# Patient Record
Sex: Female | Born: 1990 | Race: White | Hispanic: No | Marital: Single | Smoking: Never smoker
Health system: Southern US, Community
[De-identification: ages and names within clinical notes are randomized; demographics above are authoritative.]

## PROBLEM LIST (undated history)

## (undated) DIAGNOSIS — D649 Anemia, unspecified: Secondary | ICD-10-CM

## (undated) DIAGNOSIS — F32A Depression, unspecified: Secondary | ICD-10-CM

## (undated) DIAGNOSIS — J45909 Unspecified asthma, uncomplicated: Secondary | ICD-10-CM

## (undated) DIAGNOSIS — N8500 Endometrial hyperplasia, unspecified: Secondary | ICD-10-CM

## (undated) DIAGNOSIS — Z8489 Family history of other specified conditions: Secondary | ICD-10-CM

## (undated) DIAGNOSIS — F419 Anxiety disorder, unspecified: Secondary | ICD-10-CM

## (undated) DIAGNOSIS — G47419 Narcolepsy without cataplexy: Secondary | ICD-10-CM

## (undated) DIAGNOSIS — T8859XA Other complications of anesthesia, initial encounter: Secondary | ICD-10-CM

## (undated) HISTORY — PX: OTHER SURGICAL HISTORY: SHX169

## (undated) HISTORY — PX: OVARIAN CYST REMOVAL: SHX89

---
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
## 2017-04-16 ENCOUNTER — Emergency Department (HOSPITAL_COMMUNITY)
Admission: EM | Admit: 2017-04-16 | Discharge: 2017-04-17 | Disposition: A | Discharge status: Home / Self Care | Payer: BLUE CROSS/BLUE SHIELD | Attending: Emergency Medicine | Admitting: Emergency Medicine

## 2017-04-16 ENCOUNTER — Encounter (HOSPITAL_COMMUNITY): Payer: Self-pay | Admitting: Emergency Medicine

## 2017-04-16 DIAGNOSIS — M25551 Pain in right hip: Secondary | ICD-10-CM | POA: Diagnosis not present

## 2017-04-16 DIAGNOSIS — M25552 Pain in left hip: Secondary | ICD-10-CM | POA: Diagnosis not present

## 2017-04-16 DIAGNOSIS — R1030 Lower abdominal pain, unspecified: Secondary | ICD-10-CM | POA: Insufficient documentation

## 2017-04-16 DIAGNOSIS — J019 Acute sinusitis, unspecified: Secondary | ICD-10-CM | POA: Insufficient documentation

## 2017-04-16 DIAGNOSIS — M545 Low back pain: Secondary | ICD-10-CM | POA: Diagnosis not present

## 2017-04-16 DIAGNOSIS — R509 Fever, unspecified: Secondary | ICD-10-CM | POA: Diagnosis not present

## 2017-04-16 DIAGNOSIS — J029 Acute pharyngitis, unspecified: Secondary | ICD-10-CM | POA: Diagnosis present

## 2017-04-16 LAB — RAPID STREP SCREEN (MED CTR MEBANE ONLY): STREPTOCOCCUS, GROUP A SCREEN (DIRECT): NEGATIVE

## 2017-04-16 MED ORDER — ACETAMINOPHEN 325 MG PO TABS
650.0000 mg | ORAL_TABLET | Freq: Once | ORAL | Status: AC | PRN
  Administered 2017-04-16: 650 mg via ORAL
  Filled 2017-04-16: qty 2

## 2017-04-16 NOTE — ED Triage Notes (Signed)
Pt comes in with complaints of bilateral hip/lower abdominal pain, back pain, URI symptoms, and fever that began yesterday into today.  Took tylenol and zyrtec around 1100.  Had some relief then but woke up feeling worse.  Low grade fever on arrival. Pt states she was around someone who had the stomach flu recently but is not having those symptoms.

## 2017-04-17 DIAGNOSIS — J019 Acute sinusitis, unspecified: Secondary | ICD-10-CM | POA: Diagnosis not present

## 2017-04-17 LAB — URINALYSIS, ROUTINE W REFLEX MICROSCOPIC
Bilirubin Urine: NEGATIVE
GLUCOSE, UA: NEGATIVE mg/dL
Hgb urine dipstick: NEGATIVE
Ketones, ur: 20 mg/dL — AB
LEUKOCYTES UA: NEGATIVE
Nitrite: NEGATIVE
PROTEIN: NEGATIVE mg/dL
SPECIFIC GRAVITY, URINE: 1.024 (ref 1.005–1.030)
pH: 5 (ref 5.0–8.0)

## 2017-04-17 LAB — POC URINE PREG, ED: PREG TEST UR: NEGATIVE

## 2017-04-17 MED ORDER — IBUPROFEN 200 MG PO TABS
600.0000 mg | ORAL_TABLET | Freq: Once | ORAL | Status: AC
  Administered 2017-04-17: 600 mg via ORAL
  Filled 2017-04-17: qty 3

## 2017-04-17 MED ORDER — MAGIC MOUTHWASH W/LIDOCAINE: 5.0000 mL | Freq: Four times a day (QID) | ORAL | 0 refills | Status: DC | PRN

## 2017-04-17 MED ORDER — FLUTICASONE PROPIONATE 50 MCG/ACT NA SUSP: 2.0000 | Freq: Every day | NASAL | 0 refills | Status: DC

## 2017-04-17 MED ORDER — IBUPROFEN 600 MG PO TABS: 600.0000 mg | ORAL_TABLET | Freq: Four times a day (QID) | ORAL | 0 refills | Status: DC | PRN

## 2017-04-17 MED ORDER — ACETAMINOPHEN 500 MG PO TABS: 500.0000 mg | ORAL_TABLET | Freq: Four times a day (QID) | ORAL | 0 refills | Status: DC | PRN

## 2017-04-17 NOTE — Discharge Instructions (Signed)
Take Tylenol as prescribed for fever and ibuprofen for fever or body aches. We recommend Magic mouthwash for sore throat. You may gargle and swallow this as prescribed. Use Flonase in addition to over-the-counter Claritin or Zyrtec for congestion. You may also try Mucinex, if desired. We advise follow-up with your primary care doctor to ensure resolution of symptoms. Be sure to drink plenty of clear liquids to prevent dehydration. You may return to the emergency department for new or concerning symptoms.

## 2017-04-17 NOTE — ED Provider Notes (Signed)
[facility] DEPT Provider Note   CSN: [id number] Arrival date & time: 04/16/17  1918    History   Chief Complaint Chief Complaint  Patient presents with  . Fever  . Back Pain  . Sore Throat  . Abdominal Pain    HPI Pamela Bates is a 26 y.o. female.  25 year old female presents to the emergency department for upper respiratory symptoms including nasal congestion and sore throat that began yesterday. Symptoms have been associated with a fever with maximum temperature of 101F. She has also noted associated bilateral hip and lower abdominal/back pain. She has been taking Zyrtec as well as Tylenol without relief of her symptoms. These were last taken at 11 AM today. She was around an individual ill with the stomach flu, though she has not had any diarrhea or vomiting. She denies any cough or shortness of breath. No inability to swallow or drooling.   The history is provided by the patient. No language interpreter was used.  Fever    Back Pain   Associated symptoms include a fever and abdominal pain.  Sore Throat  Associated symptoms include abdominal pain.  Abdominal Pain   Associated symptoms include fever.    History reviewed. No pertinent past medical history.  There are no active problems to display for this patient.   Past Surgical History:  Procedure Laterality Date  . OVARIAN CYST REMOVAL      OB History    No data available       Home Medications    Prior to Admission medications   Medication Sig Start Date End Date Taking? Authorizing Provider  acetaminophen (TYLENOL) 500 MG tablet Take 1 tablet (500 mg total) by mouth every 6 (six) hours as needed. 04/17/17   Antonietta Breach, PA-C  fluticasone (FLONASE) 50 MCG/ACT nasal spray Place 2 sprays into both nostrils daily. 04/17/17   Antonietta Breach, PA-C  ibuprofen (ADVIL,MOTRIN) 600 MG tablet Take 1 tablet (600 mg total) by mouth every 6 (six) hours as needed. 04/17/17   Antonietta Breach, PA-C  magic mouthwash  w/lidocaine SOLN Take 5 mLs by mouth 4 (four) times daily as needed (sore throat). 04/17/17   Antonietta Breach, PA-C    Family History No family history on file.  Social History Social History  Substance Use Topics  . Smoking status: Never Smoker  . Smokeless tobacco: Never Used  . Alcohol use Yes     Comment: 2-3 x a week     Allergies   Patient has no known allergies.   Review of Systems Review of Systems  Constitutional: Positive for fever.  Gastrointestinal: Positive for abdominal pain.  Musculoskeletal: Positive for back pain.  Ten systems reviewed and are negative for acute change, except as noted in the HPI.    Physical Exam Updated Vital Signs BP (!) 91/50 (BP Location: Right Arm)   Pulse 78   Temp 98.2 F (36.8 C) (Oral)   Resp 18   Ht 5\' 3"  (1.6 m)   Wt 58.3 kg (128 lb 9.6 oz)   LMP 04/02/2017 (Approximate)   SpO2 99%   BMI 22.78 kg/m   Physical Exam  Constitutional: She is oriented to person, place, and time. She appears well-developed and well-nourished. No distress.  Nontoxic appearing and in no acute distress  HENT:  Head: Normocephalic and atraumatic.  Mild posterior oropharyngeal erythema. Uvula midline. Patient tolerating secretions without difficulty.no voice hoarseness.   Eyes: Conjunctivae and EOM are normal. No scleral icterus.  Neck: Normal range of  motion.  No nuchal rigidity or meningismus  Cardiovascular: Normal rate, regular rhythm and intact distal pulses.   Patient not tachycardic as noted in triage  Pulmonary/Chest: Effort normal. No respiratory distress. She has no wheezes. She has no rales.  Respirations even and unlabored  Abdominal:  Minimal left lower quadrant tenderness. Abdomen soft, nondistended. No peritoneal signs.  Musculoskeletal: Normal range of motion.  Neurological: She is alert and oriented to person, place, and time. She exhibits normal muscle tone. Coordination normal.  GCS 15. Patient moving all extremities.  Skin:  Skin is warm and dry. No rash noted. She is not diaphoretic. No erythema. No pallor.  Psychiatric: She has a normal mood and affect. Her behavior is normal.  Nursing note and vitals reviewed.    ED Treatments / Results  Labs (all labs ordered are listed, but only abnormal results are displayed) Labs Reviewed  URINALYSIS, ROUTINE W REFLEX MICROSCOPIC - Abnormal; Notable for the following:       Result Value   APPearance HAZY (*)    Ketones, ur 20 (*)    All other components within normal limits  RAPID STREP SCREEN (NOT AT [facility])  CULTURE, GROUP A STREP ([place])  POC URINE PREG, ED    EKG  EKG Interpretation None       Radiology No results found.  Procedures Procedures (including critical care time)  Medications Ordered in ED Medications  acetaminophen (TYLENOL) tablet 650 mg (650 mg Oral Given 04/16/17 2023)  ibuprofen (ADVIL,MOTRIN) tablet 600 mg (600 mg Oral Given 04/17/17 0041)     Initial Impression / Assessment and Plan / ED Course  I have reviewed the triage vital signs and the nursing notes.  Pertinent labs & imaging results that were available during my care of the patient were reviewed by me and considered in my medical decision making (see chart for details).     Patient complaining of symptoms c/w sinusitis. Mild to moderate symptoms of clear/yellow nasal discharge/congestion and sore throat x 2 days. Patient noted to be febrile on arrival. This responded appropriately to antipyretics. Strep screen is negative. Patient also complaining of vague abdominal pain. No vomiting or diarrhea. She has minimal left lower abdominal tenderness. No distention, peritoneal signs. No tenderness at McBurney's point to suggest appendicitis. Urinalysis negative for UTI. Pregnancy also negative.  Suspect onset of symptoms to be due to viral etiology. Patient has had significant improvement in the emergency department following supportive care. Repeat exam stable. Patient to be  discharged with symptomatic treatment. Return precautions discussed and provided. Patient discharged in stable condition with no unaddressed concerns.   Final Clinical Impressions(s) / ED Diagnoses   Final diagnoses:  Febrile illness  Acute sinusitis, recurrence not specified, unspecified location    New Prescriptions New Prescriptions   ACETAMINOPHEN (TYLENOL) 500 MG TABLET    Take 1 tablet (500 mg total) by mouth every 6 (six) hours as needed.   FLUTICASONE (FLONASE) 50 MCG/ACT NASAL SPRAY    Place 2 sprays into both nostrils daily.   IBUPROFEN (ADVIL,MOTRIN) 600 MG TABLET    Take 1 tablet (600 mg total) by mouth every 6 (six) hours as needed.   MAGIC MOUTHWASH W/LIDOCAINE SOLN    Take 5 mLs by mouth 4 (four) times daily as needed (sore throat).     Antonietta Breach, PA-C 58/59/29 2446    Delora Fuel, MD 28/63/81 [phone]

## 2017-04-17 NOTE — ED Notes (Signed)
Patient denies pain and is resting comfortably.  

## 2017-04-19 LAB — CULTURE, GROUP A STREP (THRC)

## 2020-03-19 ENCOUNTER — Other Ambulatory Visit: Payer: Self-pay | Admitting: Internal Medicine

## 2020-03-19 DIAGNOSIS — R102 Pelvic and perineal pain: Secondary | ICD-10-CM

## 2020-04-13 ENCOUNTER — Ambulatory Visit
Admission: RE | Admit: 2020-04-13 | Discharge: 2020-04-13 | Disposition: A | Discharge status: Home / Self Care | Payer: BC Managed Care – PPO | Source: Ambulatory Visit | Attending: Internal Medicine | Admitting: Internal Medicine

## 2020-04-13 DIAGNOSIS — R102 Pelvic and perineal pain: Secondary | ICD-10-CM

## 2021-03-19 ENCOUNTER — Inpatient Hospital Stay (HOSPITAL_BASED_OUTPATIENT_CLINIC_OR_DEPARTMENT_OTHER)
Admission: EM | Admit: 2021-03-19 | Discharge: 2021-03-22 | DRG: 872 | Disposition: A | Discharge status: Home / Self Care | Payer: BC Managed Care – PPO | Attending: Internal Medicine | Admitting: Internal Medicine

## 2021-03-19 ENCOUNTER — Emergency Department (HOSPITAL_BASED_OUTPATIENT_CLINIC_OR_DEPARTMENT_OTHER): Payer: BC Managed Care – PPO

## 2021-03-19 ENCOUNTER — Encounter (HOSPITAL_BASED_OUTPATIENT_CLINIC_OR_DEPARTMENT_OTHER): Payer: Self-pay | Admitting: Emergency Medicine

## 2021-03-19 ENCOUNTER — Other Ambulatory Visit: Payer: Self-pay

## 2021-03-19 DIAGNOSIS — D509 Iron deficiency anemia, unspecified: Secondary | ICD-10-CM | POA: Diagnosis not present

## 2021-03-19 DIAGNOSIS — E876 Hypokalemia: Secondary | ICD-10-CM | POA: Diagnosis not present

## 2021-03-19 DIAGNOSIS — R1310 Dysphagia, unspecified: Secondary | ICD-10-CM | POA: Diagnosis present

## 2021-03-19 DIAGNOSIS — A400 Sepsis due to streptococcus, group A: Secondary | ICD-10-CM | POA: Diagnosis not present

## 2021-03-19 DIAGNOSIS — Z20822 Contact with and (suspected) exposure to covid-19: Secondary | ICD-10-CM | POA: Diagnosis present

## 2021-03-19 DIAGNOSIS — D271 Benign neoplasm of left ovary: Secondary | ICD-10-CM | POA: Diagnosis present

## 2021-03-19 DIAGNOSIS — E860 Dehydration: Secondary | ICD-10-CM | POA: Diagnosis present

## 2021-03-19 DIAGNOSIS — A419 Sepsis, unspecified organism: Principal | ICD-10-CM

## 2021-03-19 DIAGNOSIS — B373 Candidiasis of vulva and vagina: Secondary | ICD-10-CM | POA: Diagnosis present

## 2021-03-19 DIAGNOSIS — J020 Streptococcal pharyngitis: Secondary | ICD-10-CM

## 2021-03-19 HISTORY — DX: Endometrial hyperplasia, unspecified: N85.00

## 2021-03-19 HISTORY — DX: Anemia, unspecified: D64.9

## 2021-03-19 LAB — URINALYSIS, DIPSTICK ONLY
Bilirubin Urine: NEGATIVE
Glucose, UA: NEGATIVE mg/dL
Hgb urine dipstick: NEGATIVE
Ketones, ur: >80 mg/dL — AB
Nitrite: NEGATIVE
Protein, ur: 30 mg/dL — AB
Specific Gravity, Urine: 1.032 — ABNORMAL HIGH (ref 1.005–1.030)
pH: 6 (ref 5.0–8.0)

## 2021-03-19 LAB — CBC WITH DIFFERENTIAL/PLATELET
Abs Immature Granulocytes: 0.35 10*3/uL — ABNORMAL HIGH (ref 0.00–0.07)
Basophils Absolute: 0.1 10*3/uL (ref 0.0–0.1)
Basophils Relative: 0 %
Eosinophils Absolute: 0 10*3/uL (ref 0.0–0.5)
Eosinophils Relative: 0 %
HCT: 29.4 % — ABNORMAL LOW (ref 36.0–46.0)
Hemoglobin: 9.3 g/dL — ABNORMAL LOW (ref 12.0–15.0)
Immature Granulocytes: 1 %
Lymphocytes Relative: 2 %
Lymphs Abs: 0.4 10*3/uL — ABNORMAL LOW (ref 0.7–4.0)
MCH: 24.6 pg — ABNORMAL LOW (ref 26.0–34.0)
MCHC: 31.6 g/dL (ref 30.0–36.0)
MCV: 77.8 fL — ABNORMAL LOW (ref 80.0–100.0)
Monocytes Absolute: 0.8 10*3/uL (ref 0.1–1.0)
Monocytes Relative: 3 %
Neutro Abs: 25.1 10*3/uL — ABNORMAL HIGH (ref 1.7–7.7)
Neutrophils Relative %: 94 %
Platelets: 352 10*3/uL (ref 150–400)
RBC: 3.78 MIL/uL — ABNORMAL LOW (ref 3.87–5.11)
RDW: 16.3 % — ABNORMAL HIGH (ref 11.5–15.5)
WBC: 26.8 10*3/uL — ABNORMAL HIGH (ref 4.0–10.5)
nRBC: 0 % (ref 0.0–0.2)

## 2021-03-19 LAB — LACTIC ACID, PLASMA: Lactic Acid, Venous: 0.8 mmol/L (ref 0.5–1.9)

## 2021-03-19 LAB — COMPREHENSIVE METABOLIC PANEL
ALT: 10 U/L (ref 0–44)
AST: 18 U/L (ref 15–41)
Albumin: 4.2 g/dL (ref 3.5–5.0)
Alkaline Phosphatase: 28 U/L — ABNORMAL LOW (ref 38–126)
Anion gap: 11 (ref 5–15)
BUN: 11 mg/dL (ref 6–20)
CO2: 23 mmol/L (ref 22–32)
Calcium: 9 mg/dL (ref 8.9–10.3)
Chloride: 103 mmol/L (ref 98–111)
Creatinine, Ser: 0.75 mg/dL (ref 0.44–1.00)
GFR, Estimated: >60 mL/min (ref >60)
Glucose, Bld: 96 mg/dL (ref 70–99)
Potassium: 3.4 mmol/L — ABNORMAL LOW (ref 3.5–5.1)
Sodium: 137 mmol/L (ref 135–145)
Total Bilirubin: 0.6 mg/dL (ref 0.3–1.2)
Total Protein: 7.3 g/dL (ref 6.5–8.1)

## 2021-03-19 LAB — RAPID HIV SCREEN (HIV 1/2 AB+AG)
HIV 1/2 Antibodies: NONREACTIVE
HIV-1 P24 Antigen - HIV24: NONREACTIVE

## 2021-03-19 LAB — RESP PANEL BY RT-PCR (FLU A&B, COVID) ARPGX2
Influenza A by PCR: NEGATIVE
Influenza B by PCR: NEGATIVE
SARS Coronavirus 2 by RT PCR: NEGATIVE

## 2021-03-19 LAB — LIPASE, BLOOD: Lipase: 14 U/L (ref 11–51)

## 2021-03-19 LAB — MONONUCLEOSIS SCREEN: Mono Screen: NEGATIVE

## 2021-03-19 LAB — PREGNANCY, URINE: Preg Test, Ur: NEGATIVE

## 2021-03-19 LAB — GROUP A STREP BY PCR: Group A Strep by PCR: DETECTED — AB

## 2021-03-19 MED ORDER — SODIUM CHLORIDE 0.9 % IV BOLUS
1000.0000 mL | Freq: Once | INTRAVENOUS | Status: AC
  Administered 2021-03-19: 1000 mL via INTRAVENOUS

## 2021-03-19 MED ORDER — IOHEXOL 300 MG/ML  SOLN
100.0000 mL | Freq: Once | INTRAMUSCULAR | Status: AC | PRN
  Administered 2021-03-19: 100 mL via INTRAVENOUS

## 2021-03-19 MED ORDER — PIPERACILLIN-TAZOBACTAM 3.375 G IVPB: 3.3750 g | Freq: Three times a day (TID) | INTRAVENOUS | Status: DC

## 2021-03-19 MED ORDER — KETOROLAC TROMETHAMINE 30 MG/ML IJ SOLN
30.0000 mg | Freq: Once | INTRAMUSCULAR | Status: AC
Start: 2021-03-19 — End: 2021-03-19
  Administered 2021-03-19: 30 mg via INTRAVENOUS
  Filled 2021-03-19: qty 1

## 2021-03-19 MED ORDER — ONDANSETRON HCL 4 MG/2ML IJ SOLN
4.0000 mg | Freq: Once | INTRAMUSCULAR | Status: AC
  Administered 2021-03-19: 4 mg via INTRAVENOUS
  Filled 2021-03-19: qty 2

## 2021-03-19 MED ORDER — PIPERACILLIN-TAZOBACTAM 3.375 G IVPB 30 MIN
3.3750 g | Freq: Once | INTRAVENOUS | Status: AC
  Administered 2021-03-19: 3.375 g via INTRAVENOUS
  Filled 2021-03-19: qty 50

## 2021-03-19 MED ORDER — VANCOMYCIN HCL 750 MG/150ML IV SOLN
750.0000 mg | Freq: Two times a day (BID) | INTRAVENOUS | Status: DC
  Filled 2021-03-19: qty 150

## 2021-03-19 MED ORDER — SODIUM CHLORIDE 0.9 % IV SOLN: INTRAVENOUS | Status: DC | PRN

## 2021-03-19 MED ORDER — MORPHINE SULFATE (PF) 4 MG/ML IV SOLN
4.0000 mg | Freq: Once | INTRAVENOUS | Status: AC
  Administered 2021-03-19: 4 mg via INTRAVENOUS
  Filled 2021-03-19: qty 1

## 2021-03-19 MED ORDER — VANCOMYCIN HCL IN DEXTROSE 1-5 GM/200ML-% IV SOLN
1000.0000 mg | Freq: Once | INTRAVENOUS | Status: AC
  Administered 2021-03-19: 1000 mg via INTRAVENOUS
  Filled 2021-03-19: qty 200

## 2021-03-19 NOTE — Progress Notes (Signed)
Pharmacy Antibiotic Note  Pamela Bates is a 30 y.o. female admitted on 03/19/2021 with sepsis.  Pharmacy has been consulted for vancomycin and zosyn dosing.  Patient endorsing  fevers and chills, muscle aches, fatigue, sore throat with abdominal pain, nausea and vomiting. Rapid strep test and COVID test negative at PCP.  SCr wnl.  Plan: Zosyn 3.37g q8h Vancomycin 1000mg  once then 750mg  q12h unless change in renal function Pharmacy to adjust dosing per renal function Follow up with cultures and de-escalate if appropriate     Temp (24hrs), Avg:99.1 F (37.3 C), Min:98.3 F (36.8 C), Max:100.2 F (37.9 C)  Recent Labs  Lab 03/19/21 1543 03/19/21 1700  WBC 26.8*  --   CREATININE 0.75  --   LATICACIDVEN  --  0.8    CrCl cannot be calculated (Unknown ideal weight.).    No Known Allergies  Antimicrobials this admission: Vancomycin 7/16 >>  Zosyn 7/16 >>   Microbiology results: Pending  Thank you for allowing pharmacy to be a part of this patient's care.  Heloise Purpura 03/19/2021 9:26 PM

## 2021-03-19 NOTE — ED Triage Notes (Signed)
Pt c/o severe lower abdominal pain/vomit once/sore throat/swelling, now having back pain as well.

## 2021-03-19 NOTE — ED Notes (Signed)
Pt transported to CT ?

## 2021-03-19 NOTE — ED Provider Notes (Signed)
[facility] EMERGENCY DEPT Provider Note   CSN: [id number] Arrival date & time: 03/19/21  1313     History Chief Complaint  Patient presents with   Abdominal Pain    Pamela Bates is a 30 y.o. female with a history of iron deficiency anemia, endometriosis, presenting to emergency department with a constellation of symptoms.  She reports that she woke up with a migraine 2 days ago.  He subsequently developed fevers and chills, muscle aches, fatigue, sore throat.  She has abdominal pain, nausea and vomiting.  She denies diarrhea or constipation has regular bowel movement.  She developed bilateral low back pain in the past 24 hours.  She did try to go to urgent care to see her PCP in the past 2 days and had a negative rapid COVID test and negative strep test.  She reports has been taking Tylenol and Motrin as needed for pain at home, and has been giving her minimal relief.  She does report scant vaginal bleeding reports she is at the end of her menstrual period.  She denies any dysuria, hematuria, history of kidney infection.  She reports he had 1 UTI in the past but this does not feel like that.  She denies history of kidney stone.  NKDA  HPI     Past Medical History:  Diagnosis Date   Anemia    Endometrial hyperplasia, unspecified     Patient Active Problem List   Diagnosis Date Noted   Hypokalemia 03/20/2021   Sepsis ([place]) 03/20/2021   Microcytic anemia    Sepsis due to group A Streptococcus ([place]) 03/19/2021    Past Surgical History:  Procedure Laterality Date   OVARIAN CYST REMOVAL       OB History   No obstetric history on file.     History reviewed. No pertinent family history.  Social History   Tobacco Use   Smoking status: Never   Smokeless tobacco: Never  Substance Use Topics   Alcohol use: Yes    Comment: 2-3 x a week   Drug use: No    Home Medications Prior to Admission medications   Medication Sig Start Date End Date Taking?  Authorizing Provider  acetaminophen (TYLENOL) 500 MG tablet Take 1 tablet (500 mg total) by mouth every 6 (six) hours as needed. 04/17/17   Antonietta Breach, PA-C  fluticasone (FLONASE) 50 MCG/ACT nasal spray Place 2 sprays into both nostrils daily. 04/17/17   Antonietta Breach, PA-C  ibuprofen (ADVIL,MOTRIN) 600 MG tablet Take 1 tablet (600 mg total) by mouth every 6 (six) hours as needed. 04/17/17   Antonietta Breach, PA-C  magic mouthwash w/lidocaine SOLN Take 5 mLs by mouth 4 (four) times daily as needed (sore throat). 04/17/17   Antonietta Breach, PA-C    Allergies    Patient has no known allergies.  Review of Systems   Review of Systems  Constitutional:  Positive for appetite change, fatigue and fever. Negative for chills.  Eyes:  Negative for photophobia and visual disturbance.  Respiratory:  Negative for cough and shortness of breath.   Cardiovascular:  Negative for chest pain and palpitations.  Gastrointestinal:  Positive for abdominal pain, nausea and vomiting. Negative for blood in stool, constipation and diarrhea.  Genitourinary:  Positive for vaginal bleeding. Negative for dysuria, hematuria, vaginal discharge and vaginal pain.  Musculoskeletal:  Positive for arthralgias, back pain and myalgias.  Skin:  Negative for color change and rash.  Neurological:  Positive for headaches. Negative for syncope.  All  other systems reviewed and are negative.  Physical Exam Updated Vital Signs BP (!) 96/58 (BP Location: Left Arm)   Pulse 91   Temp 98.8 F (37.1 C) (Oral)   Resp 16   Ht 5' (1.524 m)   Wt 58.5 kg   SpO2 98%   BMI 25.19 kg/m   Physical Exam Constitutional:      General: She is not in acute distress. HENT:     Head: Normocephalic and atraumatic.     Mouth/Throat:     Mouth: Mucous membranes are moist.     Pharynx: Uvula midline.     Tonsils: No tonsillar exudate.     Comments: There is bilateral tonsillar swelling and erythema without evident exudates. Eyes:     Conjunctiva/sclera:  Conjunctivae normal.     Pupils: Pupils are equal, round, and reactive to light.  Cardiovascular:     Rate and Rhythm: Normal rate and regular rhythm.  Pulmonary:     Effort: Pulmonary effort is normal. No respiratory distress.  Abdominal:     General: There is no distension.     Tenderness: There is abdominal tenderness in the right lower quadrant, suprapubic area and left lower quadrant. There is no right CVA tenderness or left CVA tenderness. Negative signs include Murphy's sign.  Skin:    General: Skin is warm and dry.  Neurological:     General: No focal deficit present.     Mental Status: She is alert. Mental status is at baseline.  Psychiatric:        Mood and Affect: Mood normal.        Behavior: Behavior normal.      ED Results / Procedures / Treatments   Labs (all labs ordered are listed, but only abnormal results are displayed) Labs Reviewed  GROUP A STREP BY PCR - Abnormal; Notable for the following components:      Result Value   Group A Strep by PCR DETECTED (*)    All other components within normal limits  COMPREHENSIVE METABOLIC PANEL - Abnormal; Notable for the following components:   Potassium 3.4 (*)    Alkaline Phosphatase 28 (*)    All other components within normal limits  CBC WITH DIFFERENTIAL/PLATELET - Abnormal; Notable for the following components:   WBC 26.8 (*)    RBC 3.78 (*)    Hemoglobin 9.3 (*)    HCT 29.4 (*)    MCV 77.8 (*)    MCH 24.6 (*)    RDW 16.3 (*)    Neutro Abs 25.1 (*)    Lymphs Abs 0.4 (*)    Abs Immature Granulocytes 0.35 (*)    All other components within normal limits  URINALYSIS, DIPSTICK ONLY - Abnormal; Notable for the following components:   Specific Gravity, Urine 1.032 (*)    Ketones, ur >80 (*)    Protein, ur 30 (*)    Leukocytes,Ua TRACE (*)    All other components within normal limits  PROTIME-INR - Abnormal; Notable for the following components:   Prothrombin Time 16.8 (*)    INR 1.4 (*)    All other  components within normal limits  CBC WITH DIFFERENTIAL/PLATELET - Abnormal; Notable for the following components:   WBC 20.2 (*)    RBC 2.99 (*)    Hemoglobin 7.5 (*)    HCT 22.9 (*)    MCV 76.6 (*)    MCH 25.1 (*)    RDW 16.2 (*)    Neutro Abs 17.8 (*)  Abs Immature Granulocytes 0.27 (*)    All other components within normal limits  BASIC METABOLIC PANEL - Abnormal; Notable for the following components:   Sodium 133 (*)    Potassium 3.1 (*)    CO2 21 (*)    Calcium 7.7 (*)    All other components within normal limits  IRON AND TIBC - Abnormal; Notable for the following components:   Iron 6 (*)    Saturation Ratios 2 (*)    All other components within normal limits  RETICULOCYTES - Abnormal; Notable for the following components:   RBC. 2.92 (*)    Immature Retic Fract 17.1 (*)    All other components within normal limits  RESP PANEL BY RT-PCR (FLU A&B, COVID) ARPGX2  CULTURE, BLOOD (ROUTINE X 2)  CULTURE, BLOOD (ROUTINE X 2)  LIPASE, BLOOD  PREGNANCY, URINE  MONONUCLEOSIS SCREEN  RAPID HIV SCREEN (HIV 1/2 AB+AG)  LACTIC ACID, PLASMA  LACTIC ACID, PLASMA  APTT  MAGNESIUM  VITAMIN B12  FOLATE  FERRITIN    EKG None  Radiology CT SOFT TISSUE NECK W CONTRAST  Result Date: 03/20/2021 CLINICAL DATA:  30 year old female with sore throat and suspected tonsillitis. EXAM: CT NECK WITH CONTRAST TECHNIQUE: Multidetector CT imaging of the neck was performed using the standard protocol following the bolus administration of intravenous contrast. CONTRAST:  41mL OMNIPAQUE IOHEXOL 300 MG/ML  SOLN COMPARISON:  None. FINDINGS: Pharynx and larynx: Negative larynx. Epiglottis remains normal. Hypopharynx is distended with air, but otherwise normal. Symmetric enlargement and variegated hyperenhancement of the bilateral palatine tonsils. Asymmetric but indistinct left tonsillar hypodensity as seen on coronal image 49. Superimposed generalized oropharyngeal mucosal hyperenhancement. Less  enlargement but similar variegated hyperenhancement of the adenoids. No convincing parapharyngeal or retropharyngeal space inflammation or edema at this time. Salivary glands: Negative sublingual space. Submandibular glands and parotid glands remain within normal limits. Thyroid: Negative. Lymph nodes: Positive for enhancing and enlarged cervical lymph nodes bilaterally. Retropharyngeal lymph nodes are involved right slightly greater than left and individually measure up to 7 mm short axis. The largest nodes are at the level 2/level 3 junctions individually measuring 15-17 mm short axis. Numerous level 2 B lymph nodes are involved bilaterally. But no cystic or necrotic node is identified. Level 1 and level 4 are relatively spared. Vascular: Major vascular structures in the neck and at the skull base remain patent including both internal jugular veins, the right is dominant. Tortuous distal cervical ICAs more so the right. Right vertebral artery also appears dominant. Limited intracranial: Negative. Visualized orbits: Negative. Mastoids and visualized paranasal sinuses: Scattered bilateral paranasal sinus mucosal thickening and bubbly opacity. Nasal cavity involvement. Frontal sinuses are spared. Tympanic cavities and mastoids are clear. Skeleton: No dental abnormality identified. No osseous abnormality identified. Upper chest: Negative. Small volume residual thymus is within normal limits. Upper lungs are clear. IMPRESSION: 1. Acute Tonsillitis with reactive retropharyngeal and bilateral level 2/3 lymphadenopathy. 2. Asymmetric hypodensity in the left palatine tonsil (coronal image 49) suspicious for developing tonsillar abscess, although unlikely to be drainable at this time. No parapharyngeal or retropharyngeal space involvement. And no other complicating features. 3. Superimposed Acute Rhinosinusitis. Electronically Signed   By: Genevie Ann M.D.   On: 03/20/2021 04:50   CT ABDOMEN PELVIS W CONTRAST  Result Date:  03/19/2021 CLINICAL DATA:  Left lower quadrant abdominal pain, left flank pain EXAM: CT ABDOMEN AND PELVIS WITH CONTRAST TECHNIQUE: Multidetector CT imaging of the abdomen and pelvis was performed using the standard protocol following bolus  administration of intravenous contrast. CONTRAST:  141mL OMNIPAQUE IOHEXOL 300 MG/ML  SOLN COMPARISON:  None. FINDINGS: Lower chest: No acute abnormality. Hepatobiliary: No focal liver abnormality is seen. No gallstones, gallbladder wall thickening, or biliary dilatation. Pancreas: Unremarkable Spleen: Unremarkable Adrenals/Urinary Tract: Adrenal glands are unremarkable. Kidneys are normal, without renal calculi, focal lesion, or hydronephrosis. Bladder is unremarkable. Stomach/Bowel: Stomach is within normal limits. Appendix appears normal. No evidence of bowel wall thickening, distention, or inflammatory changes. No free intraperitoneal gas. Vascular/Lymphatic: No significant vascular findings are present. No enlarged abdominal or pelvic lymph nodes. Reproductive: A macroscopic fat containing mass is again identified within the left ovary compatible with an ovarian dermoid measuring 5.3 x 5.4 x 7.0 cm on axial image # 68 and sagittal reformat # 61. Trace free fluid within the pelvis may be physiologic in a female patient of this age. The pelvic organs are otherwise unremarkable. Other: No abdominal wall hernia.  The rectum is unremarkable. Musculoskeletal: No acute bone abnormality. No lytic or blastic bone lesions are identified. IMPRESSION: No acute intra-abdominal pathology identified. 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 As noted previously, this may predispose to torsion and gynecologic consultation may be helpful for further management. Electronically Signed   By: Fidela Salisbury MD   On: 03/19/2021 20:32   DG Chest Portable 1 View  Result Date: 03/19/2021 CLINICAL DATA:  Lower abdominal pain EXAM: PORTABLE CHEST 1 VIEW COMPARISON:  None. FINDINGS: The heart size and  mediastinal contours are within normal limits. Both lungs are clear. The visualized skeletal structures are unremarkable. IMPRESSION: No active disease. Electronically Signed   By: Donavan Foil M.D.   On: 03/19/2021 17:17    Procedures .Critical Care  Date/Time: 03/20/2021 10:35 AM Performed by: Wyvonnia Dusky, MD Authorized by: Wyvonnia Dusky, MD   Critical care provider statement:    Critical care time (minutes):  45   Critical care was necessary to treat or prevent imminent or life-threatening deterioration of the following conditions:  Sepsis   Critical care was time spent personally by me on the following activities:  Discussions with consultants, evaluation of patient's response to treatment, examination of patient, ordering and performing treatments and interventions, ordering and review of laboratory studies, ordering and review of radiographic studies, pulse oximetry, re-evaluation of patient's condition, obtaining history from patient or surrogate and review of old charts   Medications Ordered in ED Medications  acetaminophen (TYLENOL) tablet 650 mg (650 mg Oral Given 03/20/21 0656)  morphine 2 MG/ML injection 2-4 mg (2 mg Intravenous Given 03/20/21 0420)  senna-docusate (Senokot-S) tablet 1 tablet (has no administration in time range)  clindamycin (CLEOCIN) IVPB 900 mg (900 mg Intravenous New Bag/Given 03/20/21 0719)  penicillin G potassium 4 Million Units in dextrose 5 % 250 mL IVPB (has no administration in time range)  ferrous sulfate tablet 325 mg (325 mg Oral Given 03/20/21 0823)  potassium chloride (KLOR-CON) packet 40 mEq (40 mEq Oral Given 03/20/21 0944)  magnesium oxide (MAG-OX) tablet 400 mg (400 mg Oral Given 03/20/21 0944)  0.9 % NaCl with KCl 40 mEq / L  infusion ( Intravenous New Bag/Given 03/20/21 0821)  ketorolac (TORADOL) 30 MG/ML injection 30 mg (has no administration in time range)  ketorolac (TORADOL) 30 MG/ML injection 30 mg (30 mg Intravenous Given 03/19/21  1549)  ondansetron (ZOFRAN) injection 4 mg (4 mg Intravenous Given 03/19/21 1549)  sodium chloride 0.9 % bolus 1,000 mL (0 mLs Intravenous Stopped 03/19/21 1809)  morphine 4 MG/ML injection 4 mg (  4 mg Intravenous Given 03/19/21 1910)  iohexol (OMNIPAQUE) 300 MG/ML solution 100 mL (100 mLs Intravenous Contrast Given 03/19/21 2015)  piperacillin-tazobactam (ZOSYN) IVPB 3.375 g (0 g Intravenous Stopped 03/19/21 2346)  vancomycin (VANCOCIN) IVPB 1000 mg/200 mL premix (0 mg Intravenous Stopped 03/19/21 2346)  morphine 4 MG/ML injection 4 mg (4 mg Intravenous Given 03/19/21 2150)  acetaminophen (TYLENOL) tablet 650 mg (650 mg Oral Given 03/20/21 0125)  sodium chloride 0.9 % bolus 1,000 mL (1,000 mLs Intravenous New Bag/Given 03/20/21 0245)  acetaminophen (TYLENOL) tablet 325 mg (325 mg Oral Given 03/20/21 0242)  potassium chloride 10 mEq in 100 mL IVPB (10 mEq Intravenous New Bag/Given 03/20/21 0601)  potassium chloride SA (KLOR-CON) CR tablet 20 mEq (20 mEq Oral Given 03/20/21 0419)  iohexol (OMNIPAQUE) 300 MG/ML solution 75 mL (75 mLs Intravenous Contrast Given 03/20/21 0441)    ED Course  I have reviewed the triage vital signs and the nursing notes.  Pertinent labs & imaging results that were available during my care of the patient were reviewed by me and considered in my medical decision making (see chart for details).   This patient complains of a constellation of symptoms.  This involves an extensive number of treatment options, and is a complaint that carries with it a high risk of complications and morbidity.  The differential diagnosis includes viral infection including COVID versus mono versus other type of infection (Uti, intraabdominal not excluded) vs dehydration vs other  I ordered, reviewed, and interpreted labs. WBC 26.8, left shift.  Hgb 9.3 (unclear baseline, but likely consistent with hx of iron deficiency anemia), mono and HIV and covid/flu negative, strep PCR is positive, CMP and lipase  wnl.  Lactate 0.8 and reassuring.  UA without clear sign of infection  I ordered medication for pain, nausea, and BS antibiotics for infection/sepsis I ordered imaging studies which included CT abdomen pelvis and DG chest I independently visualized and interpreted imaging which showed normal chest xray, CT abdomen with left dermoid cyst (patient reports this is chronic and monitored by her OBGYN), and the monitor tracing which showed sinus tachycardia  This clinical presentation was most concerning for bacteremia/sepsis with strep as a possible source.  With a normal lactate and BP, I did not think this was severe sepsis.  I still recommended medical admission given her lab abnormalities and concern for bacteremia or system infection.  I did not feel this was related to a pelvic infection or PID, nor that this was consistent with ovarian torsion or ToA.  Her dermoid cyst is a chronic finding.  Clinical Course as of 03/20/21 1036  Sat Mar 19, 2021  1634 With her leukocytosis, rising temperature, tachycardia, I have asked for blood cultures and a lactate level to be drawn.  The patient was updated.  She will likely need CT imaging of the abdomen at this point to evaluate for possible sources of infection. [MT]  1906 We will give additional morphine for pain management.  UA is now sent and pregnancy is negative.  Patient pending CT. [MT]  2050 Discussed the work-up with the patient.  At this point we will need to admit her back to the hospital for bacteremia and sepsis rule out.  I found no source of her symptoms.  She does not want a pelvic exam at this time, and reports that she has no concern for vaginal discharge or STI or pelvic infection.  I doubt that this dermoid cyst is the cause of her symptoms, clinically does  not present as ovarian torsion, and this does not seem consistent with tubo-ovarian abscess. [MT]  2131 Admitted to hospitalist dr Beryle Quant [MT]    Clinical Course User Index [MT]  Wyvonnia Dusky, MD    Final Clinical Impression(s) / ED Diagnoses Final diagnoses:  Sepsis, due to unspecified organism, unspecified whether acute organ dysfunction present [facility])  Strep pharyngitis  Dermoid cyst of left ovary    Rx / DC Orders ED Discharge Orders     None        Wyvonnia Dusky, MD 03/20/21 1036

## 2021-03-19 NOTE — ED Notes (Signed)
Pt is aware we need a urine specimen, call bell @within  reach.

## 2021-03-19 NOTE — ED Notes (Signed)
Patient states still unable to give urine sample. 

## 2021-03-20 ENCOUNTER — Encounter (HOSPITAL_COMMUNITY): Payer: Self-pay | Admitting: Family Medicine

## 2021-03-20 ENCOUNTER — Observation Stay (HOSPITAL_COMMUNITY): Payer: BC Managed Care – PPO

## 2021-03-20 DIAGNOSIS — A419 Sepsis, unspecified organism: Secondary | ICD-10-CM | POA: Diagnosis present

## 2021-03-20 DIAGNOSIS — D271 Benign neoplasm of left ovary: Secondary | ICD-10-CM | POA: Diagnosis present

## 2021-03-20 DIAGNOSIS — E876 Hypokalemia: Secondary | ICD-10-CM | POA: Diagnosis present

## 2021-03-20 DIAGNOSIS — R1310 Dysphagia, unspecified: Secondary | ICD-10-CM | POA: Diagnosis present

## 2021-03-20 DIAGNOSIS — J020 Streptococcal pharyngitis: Secondary | ICD-10-CM | POA: Diagnosis not present

## 2021-03-20 DIAGNOSIS — B373 Candidiasis of vulva and vagina: Secondary | ICD-10-CM | POA: Diagnosis present

## 2021-03-20 DIAGNOSIS — D509 Iron deficiency anemia, unspecified: Secondary | ICD-10-CM | POA: Diagnosis present

## 2021-03-20 DIAGNOSIS — E860 Dehydration: Secondary | ICD-10-CM | POA: Diagnosis present

## 2021-03-20 DIAGNOSIS — Z20822 Contact with and (suspected) exposure to covid-19: Secondary | ICD-10-CM | POA: Diagnosis present

## 2021-03-20 DIAGNOSIS — A400 Sepsis due to streptococcus, group A: Secondary | ICD-10-CM | POA: Diagnosis present

## 2021-03-20 LAB — CBC WITH DIFFERENTIAL/PLATELET
Abs Immature Granulocytes: 0.27 10*3/uL — ABNORMAL HIGH (ref 0.00–0.07)
Basophils Absolute: 0.1 10*3/uL (ref 0.0–0.1)
Basophils Relative: 0 %
Eosinophils Absolute: 0 10*3/uL (ref 0.0–0.5)
Eosinophils Relative: 0 %
HCT: 22.9 % — ABNORMAL LOW (ref 36.0–46.0)
Hemoglobin: 7.5 g/dL — ABNORMAL LOW (ref 12.0–15.0)
Immature Granulocytes: 1 %
Lymphocytes Relative: 7 %
Lymphs Abs: 1.4 10*3/uL (ref 0.7–4.0)
MCH: 25.1 pg — ABNORMAL LOW (ref 26.0–34.0)
MCHC: 32.8 g/dL (ref 30.0–36.0)
MCV: 76.6 fL — ABNORMAL LOW (ref 80.0–100.0)
Monocytes Absolute: 0.6 10*3/uL (ref 0.1–1.0)
Monocytes Relative: 3 %
Neutro Abs: 17.8 10*3/uL — ABNORMAL HIGH (ref 1.7–7.7)
Neutrophils Relative %: 89 %
Platelets: 243 10*3/uL (ref 150–400)
RBC: 2.99 MIL/uL — ABNORMAL LOW (ref 3.87–5.11)
RDW: 16.2 % — ABNORMAL HIGH (ref 11.5–15.5)
WBC: 20.2 10*3/uL — ABNORMAL HIGH (ref 4.0–10.5)
nRBC: 0 % (ref 0.0–0.2)

## 2021-03-20 LAB — RETICULOCYTES
Immature Retic Fract: 17.1 % — ABNORMAL HIGH (ref 2.3–15.9)
RBC.: 2.92 MIL/uL — ABNORMAL LOW (ref 3.87–5.11)
Retic Count, Absolute: 40.9 10*3/uL (ref 19.0–186.0)
Retic Ct Pct: 1.4 % (ref 0.4–3.1)

## 2021-03-20 LAB — VITAMIN B12: Vitamin B-12: 299 pg/mL (ref 180–914)

## 2021-03-20 LAB — IRON AND TIBC
Iron: 6 ug/dL — ABNORMAL LOW (ref 28–170)
Saturation Ratios: 2 % — ABNORMAL LOW (ref 10.4–31.8)
TIBC: 325 ug/dL (ref 250–450)
UIBC: 319 ug/dL

## 2021-03-20 LAB — BASIC METABOLIC PANEL
Anion gap: 8 (ref 5–15)
BUN: 11 mg/dL (ref 6–20)
CO2: 21 mmol/L — ABNORMAL LOW (ref 22–32)
Calcium: 7.7 mg/dL — ABNORMAL LOW (ref 8.9–10.3)
Chloride: 104 mmol/L (ref 98–111)
Creatinine, Ser: 0.92 mg/dL (ref 0.44–1.00)
GFR, Estimated: >60 mL/min (ref >60)
Glucose, Bld: 97 mg/dL (ref 70–99)
Potassium: 3.1 mmol/L — ABNORMAL LOW (ref 3.5–5.1)
Sodium: 133 mmol/L — ABNORMAL LOW (ref 135–145)

## 2021-03-20 LAB — APTT: aPTT: 34 seconds (ref 24–36)

## 2021-03-20 LAB — FERRITIN: Ferritin: 46 ng/mL (ref 11–307)

## 2021-03-20 LAB — LACTIC ACID, PLASMA: Lactic Acid, Venous: 0.7 mmol/L (ref 0.5–1.9)

## 2021-03-20 LAB — FOLATE: Folate: 11 ng/mL (ref >5.9)

## 2021-03-20 LAB — PROTIME-INR
INR: 1.4 — ABNORMAL HIGH (ref 0.8–1.2)
Prothrombin Time: 16.8 seconds — ABNORMAL HIGH (ref 11.4–15.2)

## 2021-03-20 LAB — MAGNESIUM: Magnesium: 1.7 mg/dL (ref 1.7–2.4)

## 2021-03-20 MED ORDER — POTASSIUM CHLORIDE 10 MEQ/100ML IV SOLN
10.0000 meq | INTRAVENOUS | Status: AC
  Administered 2021-03-20 (×2): 10 meq via INTRAVENOUS
  Filled 2021-03-20 (×2): qty 100

## 2021-03-20 MED ORDER — FERROUS SULFATE 325 (65 FE) MG PO TABS
325.0000 mg | ORAL_TABLET | Freq: Every day | ORAL | Status: DC
  Administered 2021-03-20 – 2021-03-22 (×3): 325 mg via ORAL
  Filled 2021-03-20 (×3): qty 1

## 2021-03-20 MED ORDER — SODIUM CHLORIDE 0.9 % IV BOLUS (SEPSIS)
1000.0000 mL | Freq: Once | INTRAVENOUS | Status: AC
  Administered 2021-03-20: 1000 mL via INTRAVENOUS

## 2021-03-20 MED ORDER — ACETAMINOPHEN 325 MG PO TABS
ORAL_TABLET | ORAL | Status: AC
  Administered 2021-03-20: 650 mg via ORAL
  Filled 2021-03-20: qty 2

## 2021-03-20 MED ORDER — ACETAMINOPHEN 325 MG PO TABS: 650.0000 mg | ORAL_TABLET | Freq: Once | ORAL | Status: AC

## 2021-03-20 MED ORDER — SENNOSIDES-DOCUSATE SODIUM 8.6-50 MG PO TABS: 1.0000 | ORAL_TABLET | Freq: Every evening | ORAL | Status: DC | PRN

## 2021-03-20 MED ORDER — POTASSIUM CHLORIDE CRYS ER 20 MEQ PO TBCR
20.0000 meq | EXTENDED_RELEASE_TABLET | Freq: Once | ORAL | Status: AC
  Administered 2021-03-20: 20 meq via ORAL
  Filled 2021-03-20: qty 1

## 2021-03-20 MED ORDER — CLINDAMYCIN PHOSPHATE 900 MG/50ML IV SOLN
900.0000 mg | Freq: Three times a day (TID) | INTRAVENOUS | Status: DC
  Administered 2021-03-20 – 2021-03-22 (×7): 900 mg via INTRAVENOUS
  Filled 2021-03-20 (×9): qty 50

## 2021-03-20 MED ORDER — POTASSIUM CHLORIDE 20 MEQ PO PACK
40.0000 meq | PACK | Freq: Two times a day (BID) | ORAL | Status: AC
  Administered 2021-03-20: 40 meq via ORAL
  Filled 2021-03-20 (×2): qty 2

## 2021-03-20 MED ORDER — PENICILLIN G POTASSIUM 20000000 UNITS IJ SOLR
4.0000 10*6.[IU] | INTRAVENOUS | Status: DC
  Administered 2021-03-20 – 2021-03-22 (×12): 4 10*6.[IU] via INTRAVENOUS
  Filled 2021-03-20 (×15): qty 4

## 2021-03-20 MED ORDER — MAGNESIUM OXIDE -MG SUPPLEMENT 400 (240 MG) MG PO TABS
400.0000 mg | ORAL_TABLET | Freq: Two times a day (BID) | ORAL | Status: AC
  Administered 2021-03-20 (×2): 400 mg via ORAL
  Filled 2021-03-20 (×2): qty 1

## 2021-03-20 MED ORDER — PENICILLIN G POTASSIUM 20000000 UNITS IJ SOLR
4.0000 10*6.[IU] | INTRAVENOUS | Status: DC
  Administered 2021-03-20: 4 10*6.[IU] via INTRAVENOUS
  Filled 2021-03-20 (×5): qty 4

## 2021-03-20 MED ORDER — POTASSIUM CHLORIDE IN NACL 40-0.9 MEQ/L-% IV SOLN
INTRAVENOUS | Status: AC
  Filled 2021-03-20 (×2): qty 1000

## 2021-03-20 MED ORDER — ACETAMINOPHEN 325 MG PO TABS
650.0000 mg | ORAL_TABLET | Freq: Four times a day (QID) | ORAL | Status: DC | PRN
  Administered 2021-03-20 – 2021-03-22 (×6): 650 mg via ORAL
  Filled 2021-03-20 (×6): qty 2

## 2021-03-20 MED ORDER — IOHEXOL 300 MG/ML  SOLN
75.0000 mL | Freq: Once | INTRAMUSCULAR | Status: AC | PRN
  Administered 2021-03-20: 75 mL via INTRAVENOUS

## 2021-03-20 MED ORDER — SODIUM CHLORIDE 0.9 % IV SOLN: INTRAVENOUS | Status: DC

## 2021-03-20 MED ORDER — KETOROLAC TROMETHAMINE 30 MG/ML IJ SOLN
30.0000 mg | Freq: Four times a day (QID) | INTRAMUSCULAR | Status: DC | PRN
  Administered 2021-03-20 – 2021-03-21 (×4): 30 mg via INTRAVENOUS
  Filled 2021-03-20 (×5): qty 1

## 2021-03-20 MED ORDER — ACETAMINOPHEN 325 MG PO TABS: 650.0000 mg | ORAL_TABLET | Freq: Four times a day (QID) | ORAL | Status: DC | PRN

## 2021-03-20 MED ORDER — MORPHINE SULFATE (PF) 2 MG/ML IV SOLN
2.0000 mg | INTRAVENOUS | Status: DC | PRN
  Administered 2021-03-20: 2 mg via INTRAVENOUS
  Filled 2021-03-20: qty 1

## 2021-03-20 MED ORDER — ONDANSETRON HCL 4 MG/2ML IJ SOLN
4.0000 mg | Freq: Four times a day (QID) | INTRAMUSCULAR | Status: DC | PRN
  Administered 2021-03-20 – 2021-03-21 (×3): 4 mg via INTRAVENOUS
  Filled 2021-03-20 (×3): qty 2

## 2021-03-20 MED ORDER — ACETAMINOPHEN 325 MG PO TABS
325.0000 mg | ORAL_TABLET | Freq: Once | ORAL | Status: AC
  Administered 2021-03-20: 325 mg via ORAL
  Filled 2021-03-20: qty 1

## 2021-03-20 NOTE — ED Notes (Signed)
[facility] has arrived for patient 

## 2021-03-20 NOTE — Plan of Care (Signed)

## 2021-03-20 NOTE — Progress Notes (Signed)
Report given from [place] from [place].

## 2021-03-20 NOTE — Progress Notes (Signed)
   03/20/21 0209  Vitals  Temp (!) 102.4 F (39.1 C)  Temp Source Oral  BP (!) 95/53  MAP (mmHg) 66  BP Location Left Arm  BP Method Automatic  Patient Position (if appropriate) Lying  Pulse Rate (!) 117  Resp 16  MEWS COLOR  MEWS Score Color Red  Oxygen Therapy  SpO2 99 %  O2 Device Room Air  Pain Assessment  Pain Scale 0-10  Pain Score 3  Pain Type Acute pain  Pain Location Throat  Pain Descriptors / Indicators Aching  Pain Frequency Constant  Pain Onset Gradual  Patients Stated Pain Goal 0  MEWS Score  MEWS Temp 2  MEWS Systolic 1  MEWS Pulse 2  MEWS RR 0  MEWS LOC 0  MEWS Score 5  Provider Notification  Provider Name/Title Dr, Myna Hidalgo  Date Provider Notified 03/20/21  Time Provider Notified 0215  Notification Type Page  Notification Reason Critical result  Provider response See new orders;Other (Comment) (waiting to come see patient)  Date of Provider Response 03/20/21

## 2021-03-20 NOTE — H&P (Addendum)
History and Physical    CHARELL FAULK YNW:[id number] DOB: [birth year] DOA: 03/19/2021  PCP: Chesley Noon, MD   Patient coming from: Home   Chief Complaint: Shaking chills, aches, sore throat  HPI: Pamela Bates is a 30 y.o. female with medical history significant for menorrhagia, anemia, anxiety, and recurrent pharyngitis, now presenting to the emergency department for evaluation of shaking chills, generalized aches, and sore throat.  Patient began to feel poorly the night of 03/17/2021 with aches, then went on to develop worsening sore throat, pain with swallowing, and severe shaking chills.  She reports enlarged cervical lymph nodes.  She denies cough.  There has not been any rash.  She has had generalized abdominal pain and back pain.  She had negative COVID and negative rapid strep testing prior to coming to the ED.  She has been taking Tylenol and Motrin at home with minimal relief.  She denies dysuria or hematuria.  [facility] ED Course: Upon arrival to the ED, patient is found to be afebrile, saturating well on room air, with normal respiratory rate, mild tachycardia, and systolic blood pressure in the 90s and low 100s.  She went on to become febrile.  Chemistry panel notable for mild hypokalemia.  CBC with leukocytosis 26,800, hemoglobin 9.3, and MCV 77.8.  Chest x-ray negative acute cardiopulmonary disease.  CT the abdomen and pelvis negative for acute intra-abdominal or pelvic abnormality.  COVID, influenza, mononucleosis, and HIV testing were negative.  Group A strep was positive.  Lactic acid was reassuringly normal.  Blood cultures were collected in the ED and the patient was given a liter of IV fluids, morphine, Toradol, Zofran, acetaminophen, vancomycin, and Zosyn.  She was transferred to [facility] for further evaluation and management.  Review of Systems:  All other systems reviewed and apart from HPI, are negative.  Past Medical History:  Diagnosis Date    Anemia    Endometrial hyperplasia, unspecified     Past Surgical History:  Procedure Laterality Date   OVARIAN CYST REMOVAL      Social History:   reports that she has never smoked. She has never used smokeless tobacco. She reports current alcohol use. She reports that she does not use drugs.  No Known Allergies  History reviewed. No pertinent family history.   Prior to Admission medications   Medication Sig Start Date End Date Taking? Authorizing Provider  acetaminophen (TYLENOL) 500 MG tablet Take 1 tablet (500 mg total) by mouth every 6 (six) hours as needed. 04/17/17   Antonietta Breach, PA-C  fluticasone (FLONASE) 50 MCG/ACT nasal spray Place 2 sprays into both nostrils daily. 04/17/17   Antonietta Breach, PA-C  ibuprofen (ADVIL,MOTRIN) 600 MG tablet Take 1 tablet (600 mg total) by mouth every 6 (six) hours as needed. 04/17/17   Antonietta Breach, PA-C  magic mouthwash w/lidocaine SOLN Take 5 mLs by mouth 4 (four) times daily as needed (sore throat). 04/17/17   Antonietta Breach, PA-C    Physical Exam: Vitals:   03/20/21 0036 03/20/21 0209 03/20/21 0300 03/20/21 0400  BP: 103/70 (!) 95/53 (!) 95/48 109/64  Pulse: (!) 108 (!) 117 (!) 104 (!) 112  Resp: 18 16 16 17   Temp: 98.3 F (36.8 C) (!) 102.4 F (39.1 C) (!) 101.9 F (38.8 C) 99.9 F (37.7 C)  TempSrc: Oral Oral Oral Oral  SpO2: 99% 99% 98% 99%  Weight:      Height:        Constitutional: NAD, calm  Eyes: PERTLA, lids and conjunctivae normal ENMT: Mucous membranes are moist. Enlarged and erythematous tonsils with exudate.   Neck: supple, tender anterior cervical nodes Respiratory: clear to auscultation bilaterally, no wheezing, no crackles. No accessory muscle use.  Cardiovascular: Rate ~120 and regular. No extremity edema.  Abdomen: No distension, soft, no rebound pain or guarding. Bowel sounds active.  Musculoskeletal: no clubbing / cyanosis. No joint deformity upper and lower extremities.   Skin: no significant rashes,  lesions, ulcers. Warm, dry, well-perfused. Neurologic: CN 2-12 grossly intact. Sensation intact. Moving all extremities.  Psychiatric: Alert and oriented to person, place, and situation. Tearful.     Labs and Imaging on Admission: I have personally reviewed following labs and imaging studies  CBC: Recent Labs  Lab 03/19/21 1543 03/20/21 0305  WBC 26.8* 20.2*  NEUTROABS 25.1* 17.8*  HGB 9.3* 7.5*  HCT 29.4* 22.9*  MCV 77.8* 76.6*  PLT 352 258   Basic Metabolic Panel: Recent Labs  Lab 03/19/21 1543 03/20/21 0305  NA 137 133*  K 3.4* 3.1*  CL 103 104  CO2 23 21*  GLUCOSE 96 97  BUN 11 11  CREATININE 0.75 0.92  CALCIUM 9.0 7.7*   GFR: Estimated Creatinine Clearance: 72.2 mL/min (by C-G formula based on SCr of 0.92 mg/dL). Liver Function Tests: Recent Labs  Lab 03/19/21 1543  AST 18  ALT 10  ALKPHOS 28*  BILITOT 0.6  PROT 7.3  ALBUMIN 4.2   Recent Labs  Lab 03/19/21 1543  LIPASE 14   No results for input(s): AMMONIA in the last 168 hours. Coagulation Profile: Recent Labs  Lab 03/20/21 0305  INR 1.4*   Cardiac Enzymes: No results for input(s): CKTOTAL, CKMB, CKMBINDEX, TROPONINI in the last 168 hours. BNP (last 3 results) No results for input(s): PROBNP in the last 8760 hours. HbA1C: No results for input(s): HGBA1C in the last 72 hours. CBG: No results for input(s): GLUCAP in the last 168 hours. Lipid Profile: No results for input(s): CHOL, HDL, LDLCALC, TRIG, CHOLHDL, LDLDIRECT in the last 72 hours. Thyroid Function Tests: No results for input(s): TSH, T4TOTAL, FREET4, T3FREE, THYROIDAB in the last 72 hours. Anemia Panel: No results for input(s): VITAMINB12, FOLATE, FERRITIN, TIBC, IRON, RETICCTPCT in the last 72 hours. Urine analysis:    Component Value Date/Time   COLORURINE YELLOW 03/19/2021 1522   APPEARANCEUR CLEAR 03/19/2021 1522   LABSPEC 1.032 (H) 03/19/2021 1522   PHURINE 6.0 03/19/2021 1522   GLUCOSEU NEGATIVE 03/19/2021 1522    HGBUR NEGATIVE 03/19/2021 1522   BILIRUBINUR NEGATIVE 03/19/2021 1522   KETONESUR >80 (A) 03/19/2021 1522   PROTEINUR 30 (A) 03/19/2021 1522   NITRITE NEGATIVE 03/19/2021 1522   LEUKOCYTESUR TRACE (A) 03/19/2021 1522   Sepsis Labs: @LABRCNTIP (procalcitonin:4,lacticidven:4) ) Recent Results (from the past 240 hour(s))  Resp Panel by RT-PCR (Flu A&B, Covid) Nasopharyngeal Swab     Status: None   Collection Time: 03/19/21  3:43 PM   Specimen: Nasopharyngeal Swab; Nasopharyngeal(NP) swabs in vial transport medium  Result Value Ref Range Status   SARS Coronavirus 2 by RT PCR NEGATIVE NEGATIVE Final    Comment: (NOTE) SARS-CoV-2 target nucleic acids are NOT DETECTED.  The SARS-CoV-2 RNA is generally detectable in upper respiratory specimens during the acute phase of infection. The lowest concentration of SARS-CoV-2 viral copies this assay can detect is 138 copies/mL. A negative result does not preclude SARS-Cov-2 infection and should not be used as the sole basis for treatment or other patient management decisions. A negative result may  occur with  improper specimen collection/handling, submission of specimen other than nasopharyngeal swab, presence of viral mutation(s) within the areas targeted by this assay, and inadequate number of viral copies(<138 copies/mL). A negative result must be combined with clinical observations, patient history, and epidemiological information. The expected result is Negative.  Fact Sheet for Patients:  EntrepreneurPulse.com.au  Fact Sheet for Healthcare Providers:  IncredibleEmployment.be  This test is no t yet approved or cleared by the [place] FDA and  has been authorized for detection and/or diagnosis of SARS-CoV-2 by FDA under an Emergency Use Authorization (EUA). This EUA will remain  in effect (meaning this test can be used) for the duration of the COVID-19 declaration under Section 564(b)(1) of the  Act, 21 U.S.C.section 360bbb-3(b)(1), unless the authorization is terminated  or revoked sooner.       Influenza A by PCR NEGATIVE NEGATIVE Final   Influenza B by PCR NEGATIVE NEGATIVE Final    Comment: (NOTE) The Xpert Xpress SARS-CoV-2/FLU/RSV plus assay is intended as an aid in the diagnosis of influenza from Nasopharyngeal swab specimens and should not be used as a sole basis for treatment. Nasal washings and aspirates are unacceptable for Xpert Xpress SARS-CoV-2/FLU/RSV testing.  Fact Sheet for Patients: EntrepreneurPulse.com.au  Fact Sheet for Healthcare Providers: IncredibleEmployment.be  This test is not yet approved or cleared by the [place] FDA and has been authorized for detection and/or diagnosis of SARS-CoV-2 by FDA under an Emergency Use Authorization (EUA). This EUA will remain in effect (meaning this test can be used) for the duration of the COVID-19 declaration under Section 564(b)(1) of the Act, 21 U.S.C. section 360bbb-3(b)(1), unless the authorization is terminated or revoked.  Performed at [facility], [address], [place], [place] [address]   Blood culture (routine x 2)     Status: None (Preliminary result)   Collection Time: 03/19/21  4:34 PM   Specimen: BLOOD RIGHT ARM  Result Value Ref Range Status   Specimen Description BLOOD RIGHT ARM  Final   Special Requests   Final    BOTTLES DRAWN AEROBIC AND ANAEROBIC Blood Culture adequate volume Performed at [facility], [place] [address], [place], [place] [address]    Culture PENDING  Incomplete   Report Status PENDING  Incomplete  Blood culture (routine x 2)     Status: None (Preliminary result)   Collection Time: 03/19/21  4:39 PM   Specimen: BLOOD LEFT WRIST  Result Value Ref Range Status   Specimen Description BLOOD LEFT WRIST  Final   Special Requests   Final    BOTTLES DRAWN AEROBIC AND ANAEROBIC Blood Culture adequate  volume Performed at [facility], [place] [address], [place], [place] [address]    Culture PENDING  Incomplete   Report Status PENDING  Incomplete  Group A Strep by PCR     Status: Abnormal   Collection Time: 03/19/21  9:39 PM   Specimen: Throat; Sterile Swab  Result Value Ref Range Status   Group A Strep by PCR DETECTED (A) NOT DETECTED Final    Comment: Performed at [facility], [address] [place], [place] [address]     Radiological Exams on Admission: CT ABDOMEN PELVIS W CONTRAST  Result Date: 03/19/2021 CLINICAL DATA:  Left lower quadrant abdominal pain, left flank pain EXAM: CT ABDOMEN AND PELVIS WITH CONTRAST TECHNIQUE: Multidetector CT imaging of the abdomen and pelvis was performed using the standard protocol following bolus administration of intravenous contrast. CONTRAST:  180mL  OMNIPAQUE IOHEXOL 300 MG/ML  SOLN COMPARISON:  None. FINDINGS: Lower chest: No acute abnormality. Hepatobiliary: No focal liver abnormality is seen. No gallstones, gallbladder wall thickening, or biliary dilatation. Pancreas: Unremarkable Spleen: Unremarkable Adrenals/Urinary Tract: Adrenal glands are unremarkable. Kidneys are normal, without renal calculi, focal lesion, or hydronephrosis. Bladder is unremarkable. Stomach/Bowel: Stomach is within normal limits. Appendix appears normal. No evidence of bowel wall thickening, distention, or inflammatory changes. No free intraperitoneal gas. Vascular/Lymphatic: No significant vascular findings are present. No enlarged abdominal or pelvic lymph nodes. Reproductive: A macroscopic fat containing mass is again identified within the left ovary compatible with an ovarian dermoid measuring 5.3 x 5.4 x 7.0 cm on axial image # 68 and sagittal reformat # 61. Trace free fluid within the pelvis may be physiologic in a female patient of this age. The pelvic organs are otherwise unremarkable. Other: No abdominal wall hernia.  The rectum is unremarkable.  Musculoskeletal: No acute bone abnormality. No lytic or blastic bone lesions are identified. IMPRESSION: No acute intra-abdominal pathology identified. 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 As noted previously, this may predispose to torsion and gynecologic consultation may be helpful for further management. Electronically Signed   By: Fidela Salisbury MD   On: 03/19/2021 20:32   DG Chest Portable 1 View  Result Date: 03/19/2021 CLINICAL DATA:  Lower abdominal pain EXAM: PORTABLE CHEST 1 VIEW COMPARISON:  None. FINDINGS: The heart size and mediastinal contours are within normal limits. Both lungs are clear. The visualized skeletal structures are unremarkable. IMPRESSION: No active disease. Electronically Signed   By: Donavan Foil M.D.   On: 03/19/2021 17:17     Assessment/Plan   1. Sepsis due to group A streptococcus  - Presents with shaking chills, sore throat, aches, and enlarged cervical LNs and is found to be febrile with WBC 26,800, positive GAS, and enlarged tonsils with purulent drainage  - No rash or renal insufficiency  - She was cultured in ED, given IVF bolus, and started on antibiotics  - Continue IVF, antibiotics, supportive care, and check CT neck soft tissues    2. Microcytic anemia  - Hgb is 9.3 on admission, down from 10.0 two weeks ago; MCV 77.8  - No overt bleeding, likely related to her hx of menorrhagia  - Trend CBCs    3. Hypokalemia  - Replace potassium, repeat chem panel in am     DVT prophylaxis: SCDs  Code Status: Full  Level of Care: Level of care: telemetry Family Communication: mother updated by phone with patient's permission  Disposition Plan: Patient is from: Home  Anticipated d/c is to: Home  Anticipated d/c date is: 03/23/21 Patient currently: Pending CT neck, improvement in sepsis parameters  Consults called: None  Admission status: Inpatient     Vianne Bulls, MD [facility]  03/20/2021, 4:09 AM

## 2021-03-20 NOTE — Progress Notes (Signed)
   03/20/21 0400  Vitals  Temp 99.9 F (37.7 C)  Temp Source Oral  BP 109/64  MAP (mmHg) 78  BP Location Left Arm  BP Method Automatic  Patient Position (if appropriate) Lying  Pulse Rate (!) 112  Resp 17  MEWS COLOR  MEWS Score Color Yellow  Oxygen Therapy  SpO2 99 %  O2 Device Room Air  Pain Assessment  Pain Scale 0-10  Pain Score 4  Pain Type Acute pain  Pain Location Throat  Pain Descriptors / Indicators Aching  Pain Frequency Constant  Pain Onset Gradual  MEWS Score  MEWS Temp 0  MEWS Systolic 0  MEWS Pulse 2  MEWS RR 0  MEWS LOC 0  MEWS Score 2  Note  Observations improving

## 2021-03-20 NOTE — Progress Notes (Signed)
   03/20/21 0512  Assess: MEWS Score  Temp 99.4 F (37.4 C)  BP (!) 97/54  Pulse Rate (!) 101  Resp 16  SpO2 97 %  O2 Device Room Air  Assess: MEWS Score  MEWS Temp 0  MEWS Systolic 1  MEWS Pulse 1  MEWS RR 0  MEWS LOC 0  MEWS Score 2  MEWS Score Color Yellow  Treat  Pain Scale 0-10  Pain Score 0  Document  Patient Outcome Stabilized after interventions (improved)

## 2021-03-20 NOTE — Progress Notes (Signed)
Patient arrived to room 6N14 via transport. Will continue to assess. MD aware of arrival.

## 2021-03-20 NOTE — Progress Notes (Signed)
   03/20/21 0618  Assess: MEWS Score  Temp 98.8 F (37.1 C)  BP (!) 96/58  Pulse Rate 91  Resp 16  SpO2 98 %  O2 Device Room Air  Assess: MEWS Score  MEWS Temp 0  MEWS Systolic 1  MEWS Pulse 0  MEWS RR 0  MEWS LOC 0  MEWS Score 1  MEWS Score Color Green  Treat  Pain Scale 0-10  Document  Patient Outcome Stabilized after interventions (improved)

## 2021-03-20 NOTE — Progress Notes (Signed)
   03/20/21 0209 03/20/21 0300  Vitals  Temp (!) 102.4 F (39.1 C) (!) 101.9 F (38.8 C)  Temp Source  --  Oral  BP (!) 95/53 (!) 95/48  MAP (mmHg)  --  (!) 63  BP Location  --  Left Arm  BP Method  --  Automatic  Patient Position (if appropriate)  --  Lying  Pulse Rate (!) 117 (!) 104  Resp 16 16  MEWS COLOR  MEWS Score Color Red Red  Oxygen Therapy  SpO2  --  98 %  O2 Device  --  Room Air  MEWS Score  MEWS Temp 2 2  MEWS Systolic 1 1  MEWS Pulse 2 1  MEWS RR 0 0  MEWS LOC 0 0  MEWS Score 5 4

## 2021-03-20 NOTE — Progress Notes (Signed)
[facility] PROGRESS NOTE    Progress Note  Pamela Bates  QBH:[id number] DOB: [birth year] DOA: 03/19/2021 PCP: Chesley Noon, MD     Brief Narrative:   Pamela Bates is an 30 y.o. female past medical history significant for menorrhagia anemia recurrent pharyngitis presents to the ED with shaking chills and sore throat pain with swallowing, SARS-CoV-2 PCR is negative, mild hypokalemia leukocytosis of 26,000 hemoglobin of 9.3 CT of the chest and pelvis showed no acute abnormalities.  Group A strep positive  Antibiotics: Started empirically on penicillin IV clindamycin Single dose of IV vancomycin and Zosyn on admission  Microbiology data: 03/20/2019 blood culture:  Procedures: None  Assessment/Plan:   Sepsis due to group A Streptococcus [facility]): No rashes enlarged tonsils with purulent drainage. IV fluids empiric antibiotics, continue Tylenol for fever and pain IV fluids. CT scan of soft tissue of the neck hypodensity in the left palate teen tonsil no drainable abscess no pharyngeal space involvement. Relates significant dysphagia with eating. Keep her on a liquid diet.  Was spitting up when she came into the hospital. Sats have remained stable.  Microcytic anemia In a menstruating female hemoglobin is down from 9.3-7.5 she relates no signs of overt bleeding. Check an anemia panel started on iron tablets. Likely due to her history of menorrhagia.  Hypokalemia Treat orally recheck in the morning check a magnesium level.     DVT prophylaxis: lovenox Family Communication:none Status is: Observation  The patient will require care spanning > 2 midnights and should be moved to inpatient because: Hemodynamically unstable  Dispo: The patient is from: Home              Anticipated d/c is to: Home              Patient currently is not medically stable to d/c.   Difficult to place patient No        Code Status:     Code Status Orders  (From admission,  onward)           Start     Ordered   03/20/21 0404  Full code  Continuous        03/20/21 0403           Code Status History     This patient has a current code status but no historical code status.         IV Access:   Peripheral IV   Procedures and diagnostic studies:   CT SOFT TISSUE NECK W CONTRAST  Result Date: 03/20/2021 CLINICAL DATA:  30 year old female with sore throat and suspected tonsillitis. EXAM: CT NECK WITH CONTRAST TECHNIQUE: Multidetector CT imaging of the neck was performed using the standard protocol following the bolus administration of intravenous contrast. CONTRAST:  26mL OMNIPAQUE IOHEXOL 300 MG/ML  SOLN COMPARISON:  None. FINDINGS: Pharynx and larynx: Negative larynx. Epiglottis remains normal. Hypopharynx is distended with air, but otherwise normal. Symmetric enlargement and variegated hyperenhancement of the bilateral palatine tonsils. Asymmetric but indistinct left tonsillar hypodensity as seen on coronal image 49. Superimposed generalized oropharyngeal mucosal hyperenhancement. Less enlargement but similar variegated hyperenhancement of the adenoids. No convincing parapharyngeal or retropharyngeal space inflammation or edema at this time. Salivary glands: Negative sublingual space. Submandibular glands and parotid glands remain within normal limits. Thyroid: Negative. Lymph nodes: Positive for enhancing and enlarged cervical lymph nodes bilaterally. Retropharyngeal lymph nodes are involved right slightly greater than left and individually measure up to 7 mm short axis. The largest nodes  are at the level 2/level 3 junctions individually measuring 15-17 mm short axis. Numerous level 2 B lymph nodes are involved bilaterally. But no cystic or necrotic node is identified. Level 1 and level 4 are relatively spared. Vascular: Major vascular structures in the neck and at the skull base remain patent including both internal jugular veins, the right is dominant.  Tortuous distal cervical ICAs more so the right. Right vertebral artery also appears dominant. Limited intracranial: Negative. Visualized orbits: Negative. Mastoids and visualized paranasal sinuses: Scattered bilateral paranasal sinus mucosal thickening and bubbly opacity. Nasal cavity involvement. Frontal sinuses are spared. Tympanic cavities and mastoids are clear. Skeleton: No dental abnormality identified. No osseous abnormality identified. Upper chest: Negative. Small volume residual thymus is within normal limits. Upper lungs are clear. IMPRESSION: 1. Acute Tonsillitis with reactive retropharyngeal and bilateral level 2/3 lymphadenopathy. 2. Asymmetric hypodensity in the left palatine tonsil (coronal image 49) suspicious for developing tonsillar abscess, although unlikely to be drainable at this time. No parapharyngeal or retropharyngeal space involvement. And no other complicating features. 3. Superimposed Acute Rhinosinusitis. Electronically Signed   By: Genevie Ann M.D.   On: 03/20/2021 04:50   CT ABDOMEN PELVIS W CONTRAST  Result Date: 03/19/2021 CLINICAL DATA:  Left lower quadrant abdominal pain, left flank pain EXAM: CT ABDOMEN AND PELVIS WITH CONTRAST TECHNIQUE: Multidetector CT imaging of the abdomen and pelvis was performed using the standard protocol following bolus administration of intravenous contrast. CONTRAST:  123mL OMNIPAQUE IOHEXOL 300 MG/ML  SOLN COMPARISON:  None. FINDINGS: Lower chest: No acute abnormality. Hepatobiliary: No focal liver abnormality is seen. No gallstones, gallbladder wall thickening, or biliary dilatation. Pancreas: Unremarkable Spleen: Unremarkable Adrenals/Urinary Tract: Adrenal glands are unremarkable. Kidneys are normal, without renal calculi, focal lesion, or hydronephrosis. Bladder is unremarkable. Stomach/Bowel: Stomach is within normal limits. Appendix appears normal. No evidence of bowel wall thickening, distention, or inflammatory changes. No free intraperitoneal  gas. Vascular/Lymphatic: No significant vascular findings are present. No enlarged abdominal or pelvic lymph nodes. Reproductive: A macroscopic fat containing mass is again identified within the left ovary compatible with an ovarian dermoid measuring 5.3 x 5.4 x 7.0 cm on axial image # 68 and sagittal reformat # 61. Trace free fluid within the pelvis may be physiologic in a female patient of this age. The pelvic organs are otherwise unremarkable. Other: No abdominal wall hernia.  The rectum is unremarkable. Musculoskeletal: No acute bone abnormality. No lytic or blastic bone lesions are identified. IMPRESSION: No acute intra-abdominal pathology identified. 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 As noted previously, this may predispose to torsion and gynecologic consultation may be helpful for further management. Electronically Signed   By: Fidela Salisbury MD   On: 03/19/2021 20:32   DG Chest Portable 1 View  Result Date: 03/19/2021 CLINICAL DATA:  Lower abdominal pain EXAM: PORTABLE CHEST 1 VIEW COMPARISON:  None. FINDINGS: The heart size and mediastinal contours are within normal limits. Both lungs are clear. The visualized skeletal structures are unremarkable. IMPRESSION: No active disease. Electronically Signed   By: Donavan Foil M.D.   On: 03/19/2021 17:17     Medical Consultants:   None.   Subjective:    SHAMIRAH IVAN   Objective:    Vitals:   03/20/21 0300 03/20/21 0400 03/20/21 0512 03/20/21 0618  BP: (!) 95/48 109/64 (!) 97/54 (!) 96/58  Pulse: (!) 104 (!) 112 (!) 101 91  Resp: 16 17 16 16   Temp: (!) 101.9 F (38.8 C) 99.9 F (37.7 C) 99.4  F (37.4 C) 98.8 F (37.1 C)  TempSrc: Oral Oral Oral Oral  SpO2: 98% 99% 97% 98%  Weight:      Height:       SpO2: 98 %  No intake or output data in the 24 hours ending 03/20/21 0724 Filed Weights   03/19/21 2226  Weight: 58.5 kg    Exam: General exam: In no acute distress, multiple lymphadenopathy throughout the  neck Respiratory system: Good air movement and clear to auscultation. Cardiovascular system: S1 & S2 heard, RRR. No JVD. Gastrointestinal system: Abdomen is nondistended, soft and nontender.  Extremities: No pedal edema. Skin: No rashes, lesions or ulcers  Data Reviewed:    Labs: Basic Metabolic Panel: Recent Labs  Lab 03/19/21 1543 03/20/21 0305  NA 137 133*  K 3.4* 3.1*  CL 103 104  CO2 23 21*  GLUCOSE 96 97  BUN 11 11  CREATININE 0.75 0.92  CALCIUM 9.0 7.7*   GFR Estimated Creatinine Clearance: 72.2 mL/min (by C-G formula based on SCr of 0.92 mg/dL). Liver Function Tests: Recent Labs  Lab 03/19/21 1543  AST 18  ALT 10  ALKPHOS 28*  BILITOT 0.6  PROT 7.3  ALBUMIN 4.2   Recent Labs  Lab 03/19/21 1543  LIPASE 14   No results for input(s): AMMONIA in the last 168 hours. Coagulation profile Recent Labs  Lab 03/20/21 0305  INR 1.4*   COVID-19 Labs  No results for input(s): DDIMER, FERRITIN, LDH, CRP in the last 72 hours.  Lab Results  Component Value Date   [place] NEGATIVE 03/19/2021    CBC: Recent Labs  Lab 03/19/21 1543 03/20/21 0305  WBC 26.8* 20.2*  NEUTROABS 25.1* 17.8*  HGB 9.3* 7.5*  HCT 29.4* 22.9*  MCV 77.8* 76.6*  PLT 352 243   Cardiac Enzymes: No results for input(s): CKTOTAL, CKMB, CKMBINDEX, TROPONINI in the last 168 hours. BNP (last 3 results) No results for input(s): PROBNP in the last 8760 hours. CBG: No results for input(s): GLUCAP in the last 168 hours. D-Dimer: No results for input(s): DDIMER in the last 72 hours. Hgb A1c: No results for input(s): HGBA1C in the last 72 hours. Lipid Profile: No results for input(s): CHOL, HDL, LDLCALC, TRIG, CHOLHDL, LDLDIRECT in the last 72 hours. Thyroid function studies: No results for input(s): TSH, T4TOTAL, T3FREE, THYROIDAB in the last 72 hours.  Invalid input(s): FREET3 Anemia work up: No results for input(s): VITAMINB12, FOLATE, FERRITIN, TIBC, IRON, RETICCTPCT in the  last 72 hours. Sepsis Labs: Recent Labs  Lab 03/19/21 1543 03/19/21 1700 03/20/21 0305  WBC 26.8*  --  20.2*  LATICACIDVEN  --  0.8 0.7   Microbiology Recent Results (from the past 240 hour(s))  Resp Panel by RT-PCR (Flu A&B, Covid) Nasopharyngeal Swab     Status: None   Collection Time: 03/19/21  3:43 PM   Specimen: Nasopharyngeal Swab; Nasopharyngeal(NP) swabs in vial transport medium  Result Value Ref Range Status   SARS Coronavirus 2 by RT PCR NEGATIVE NEGATIVE Final    Comment: (NOTE) SARS-CoV-2 target nucleic acids are NOT DETECTED.  The SARS-CoV-2 RNA is generally detectable in upper respiratory specimens during the acute phase of infection. The lowest concentration of SARS-CoV-2 viral copies this assay can detect is 138 copies/mL. A negative result does not preclude SARS-Cov-2 infection and should not be used as the sole basis for treatment or other patient management decisions. A negative result may occur with  improper specimen collection/handling, submission of specimen other than nasopharyngeal swab, presence  of viral mutation(s) within the areas targeted by this assay, and inadequate number of viral copies(<138 copies/mL). A negative result must be combined with clinical observations, patient history, and epidemiological information. The expected result is Negative.  Fact Sheet for Patients:  EntrepreneurPulse.com.au  Fact Sheet for Healthcare Providers:  IncredibleEmployment.be  This test is no t yet approved or cleared by the [place] FDA and  has been authorized for detection and/or diagnosis of SARS-CoV-2 by FDA under an Emergency Use Authorization (EUA). This EUA will remain  in effect (meaning this test can be used) for the duration of the COVID-19 declaration under Section 564(b)(1) of the Act, 21 U.S.C.section 360bbb-3(b)(1), unless the authorization is terminated  or revoked sooner.       Influenza A by PCR  NEGATIVE NEGATIVE Final   Influenza B by PCR NEGATIVE NEGATIVE Final    Comment: (NOTE) The Xpert Xpress SARS-CoV-2/FLU/RSV plus assay is intended as an aid in the diagnosis of influenza from Nasopharyngeal swab specimens and should not be used as a sole basis for treatment. Nasal washings and aspirates are unacceptable for Xpert Xpress SARS-CoV-2/FLU/RSV testing.  Fact Sheet for Patients: EntrepreneurPulse.com.au  Fact Sheet for Healthcare Providers: IncredibleEmployment.be  This test is not yet approved or cleared by the [place] FDA and has been authorized for detection and/or diagnosis of SARS-CoV-2 by FDA under an Emergency Use Authorization (EUA). This EUA will remain in effect (meaning this test can be used) for the duration of the COVID-19 declaration under Section 564(b)(1) of the Act, 21 U.S.C. section 360bbb-3(b)(1), unless the authorization is terminated or revoked.  Performed at [facility], [address] [place], [place] [address]   Blood culture (routine x 2)     Status: None (Preliminary result)   Collection Time: 03/19/21  4:34 PM   Specimen: BLOOD RIGHT ARM  Result Value Ref Range Status   Specimen Description BLOOD RIGHT ARM  Final   Special Requests   Final    BOTTLES DRAWN AEROBIC AND ANAEROBIC Blood Culture adequate volume Performed at [facility], [place] [address], [place], [place] [address]    Culture PENDING  Incomplete   Report Status PENDING  Incomplete  Blood culture (routine x 2)     Status: None (Preliminary result)   Collection Time: 03/19/21  4:39 PM   Specimen: BLOOD LEFT WRIST  Result Value Ref Range Status   Specimen Description BLOOD LEFT WRIST  Final   Special Requests   Final    BOTTLES DRAWN AEROBIC AND ANAEROBIC Blood Culture adequate volume Performed at [facility], [place] [address] [place] [address], [place],  [address]    Culture PENDING  Incomplete   Report Status  PENDING  Incomplete  Group A Strep by PCR     Status: Abnormal   Collection Time: 03/19/21  9:39 PM   Specimen: Throat; Sterile Swab  Result Value Ref Range Status   Group A Strep by PCR DETECTED (A) NOT DETECTED Final    Comment: Performed at [facility], [address], [place], [place] [address]     Medications:    Continuous Infusions:  sodium chloride     sodium chloride 125 mL/hr at 03/20/21 0417   clindamycin (CLEOCIN) IV 900 mg (03/20/21 0719)   pencillin G potassium IV 4 Million Units (03/20/21 0649)      LOS: 0 days   Charlynne Cousins  [facility]  03/20/2021, 7:24 AM

## 2021-03-20 NOTE — Progress Notes (Signed)
   03/20/21 0400  Assess: MEWS Score  Temp 99.9 F (37.7 C)  BP 109/64  Pulse Rate (!) 112  Resp 17  SpO2 99 %  O2 Device Room Air  Assess: MEWS Score  MEWS Temp 0  MEWS Systolic 0  MEWS Pulse 2  MEWS RR 0  MEWS LOC 0  MEWS Score 2  MEWS Score Color Yellow  Treat  Pain Scale 0-10  Pain Score 4  Pain Type Acute pain  Pain Location Throat  Pain Descriptors / Indicators Aching  Pain Frequency Constant  Pain Onset Gradual  Md aware of red mews

## 2021-03-21 DIAGNOSIS — J020 Streptococcal pharyngitis: Secondary | ICD-10-CM

## 2021-03-21 LAB — CBC WITH DIFFERENTIAL/PLATELET
Abs Immature Granulocytes: 0.07 10*3/uL (ref 0.00–0.07)
Basophils Absolute: 0.1 10*3/uL (ref 0.0–0.1)
Basophils Relative: 0 %
Eosinophils Absolute: 0.5 10*3/uL (ref 0.0–0.5)
Eosinophils Relative: 3 %
HCT: 24.5 % — ABNORMAL LOW (ref 36.0–46.0)
Hemoglobin: 7.6 g/dL — ABNORMAL LOW (ref 12.0–15.0)
Immature Granulocytes: 0 %
Lymphocytes Relative: 8 %
Lymphs Abs: 1.2 10*3/uL (ref 0.7–4.0)
MCH: 24.4 pg — ABNORMAL LOW (ref 26.0–34.0)
MCHC: 31 g/dL (ref 30.0–36.0)
MCV: 78.8 fL — ABNORMAL LOW (ref 80.0–100.0)
Monocytes Absolute: 0.6 10*3/uL (ref 0.1–1.0)
Monocytes Relative: 4 %
Neutro Abs: 13.6 10*3/uL — ABNORMAL HIGH (ref 1.7–7.7)
Neutrophils Relative %: 85 %
Platelets: 283 10*3/uL (ref 150–400)
RBC: 3.11 MIL/uL — ABNORMAL LOW (ref 3.87–5.11)
RDW: 16.8 % — ABNORMAL HIGH (ref 11.5–15.5)
WBC: 16.1 10*3/uL — ABNORMAL HIGH (ref 4.0–10.5)
nRBC: 0 % (ref 0.0–0.2)

## 2021-03-21 LAB — BASIC METABOLIC PANEL
Anion gap: 4 — ABNORMAL LOW (ref 5–15)
BUN: 6 mg/dL (ref 6–20)
CO2: 24 mmol/L (ref 22–32)
Calcium: 8.4 mg/dL — ABNORMAL LOW (ref 8.9–10.3)
Chloride: 109 mmol/L (ref 98–111)
Creatinine, Ser: 0.75 mg/dL (ref 0.44–1.00)
GFR, Estimated: >60 mL/min (ref >60)
Glucose, Bld: 143 mg/dL — ABNORMAL HIGH (ref 70–99)
Potassium: 4.4 mmol/L (ref 3.5–5.1)
Sodium: 137 mmol/L (ref 135–145)

## 2021-03-21 MED ORDER — PROMETHAZINE HCL 25 MG RE SUPP
25.0000 mg | Freq: Four times a day (QID) | RECTAL | Status: DC | PRN
  Filled 2021-03-21: qty 1

## 2021-03-21 MED ORDER — FLUCONAZOLE 100MG IVPB: 100.0000 mg | INTRAVENOUS | Status: DC

## 2021-03-21 MED ORDER — FLUCONAZOLE 150 MG PO TABS
150.0000 mg | ORAL_TABLET | ORAL | Status: DC
  Filled 2021-03-21: qty 1

## 2021-03-21 MED ORDER — FLUCONAZOLE 150 MG PO TABS: 150.0000 mg | ORAL_TABLET | Freq: Once | ORAL | Status: DC

## 2021-03-21 MED ORDER — SODIUM CHLORIDE 0.9 % IV SOLN
12.5000 mg | Freq: Four times a day (QID) | INTRAVENOUS | Status: DC | PRN
  Filled 2021-03-21: qty 0.5

## 2021-03-21 MED ORDER — FLUCONAZOLE IN SODIUM CHLORIDE 200-0.9 MG/100ML-% IV SOLN
200.0000 mg | Freq: Once | INTRAVENOUS | Status: AC
  Administered 2021-03-21: 200 mg via INTRAVENOUS
  Filled 2021-03-21: qty 100

## 2021-03-21 MED ORDER — PROMETHAZINE HCL 25 MG PO TABS
25.0000 mg | ORAL_TABLET | Freq: Four times a day (QID) | ORAL | Status: DC | PRN
Start: 2021-03-21 — End: 2021-03-22
  Administered 2021-03-21: 25 mg via ORAL
  Filled 2021-03-21: qty 1

## 2021-03-21 MED ORDER — FLUCONAZOLE IN SODIUM CHLORIDE 200-0.9 MG/100ML-% IV SOLN: 200.0000 mg | Freq: Once | INTRAVENOUS | Status: DC

## 2021-03-21 NOTE — Plan of Care (Signed)

## 2021-03-21 NOTE — Progress Notes (Signed)
[facility] PROGRESS NOTE    Progress Note  Pamela Bates  NWG:[id number] DOB: 25-Dec-1990 DOA: 03/19/2021 PCP: Chesley Noon, MD     Brief Narrative:   Pamela Bates is an 30 y.o. female past medical history significant for menorrhagia anemia recurrent pharyngitis presents to the ED with shaking chills and sore throat pain with swallowing, SARS-CoV-2 PCR is negative, mild hypokalemia leukocytosis of 26,000 hemoglobin of 9.3 CT of the chest and pelvis showed no acute abnormalities.  Group A strep positive  Antibiotics: Started empirically on penicillin IV clindamycin Single dose of IV vancomycin and Zosyn on admission  Microbiology data: 03/20/2019 blood culture:  Procedures: None  Assessment/Plan:   Sepsis due to group A Streptococcus [facility]): No rashes enlarged tonsils with purulent drainage. Nauseated and vomited twice overnight. Start phenergan. Cont IV antibiotics. Afebrile leukocytosis is improved. Sat stable allow soft diet able to to eat.  Microcytic anemia: In a menstruating female hemoglobin is down from 9.3-7.5 she relates no signs of overt bleeding. Ferritin 40 cont oral tabs.  Hypokalemia Resolved with oral repletion.  Vaginal candidiasis: Copious amount of vaginal discharge and itching. Start diflucan.   DVT prophylaxis: lovenox Family Communication:none Status is: Observation  The patient will require care spanning > 2 midnights and should be moved to inpatient because: Hemodynamically unstable  Dispo: The patient is from: Home              Anticipated d/c is to: Home              Patient currently is not medically stable to d/c.   Difficult to place patient No        Code Status:     Code Status Orders  (From admission, onward)           Start     Ordered   03/20/21 0404  Full code  Continuous        03/20/21 0403           Code Status History     This patient has a current code status but no historical code  status.         IV Access:   Peripheral IV   Procedures and diagnostic studies:   CT SOFT TISSUE NECK W CONTRAST  Result Date: 03/20/2021 CLINICAL DATA:  30 year old female with sore throat and suspected tonsillitis. EXAM: CT NECK WITH CONTRAST TECHNIQUE: Multidetector CT imaging of the neck was performed using the standard protocol following the bolus administration of intravenous contrast. CONTRAST:  8mL OMNIPAQUE IOHEXOL 300 MG/ML  SOLN COMPARISON:  None. FINDINGS: Pharynx and larynx: Negative larynx. Epiglottis remains normal. Hypopharynx is distended with air, but otherwise normal. Symmetric enlargement and variegated hyperenhancement of the bilateral palatine tonsils. Asymmetric but indistinct left tonsillar hypodensity as seen on coronal image 49. Superimposed generalized oropharyngeal mucosal hyperenhancement. Less enlargement but similar variegated hyperenhancement of the adenoids. No convincing parapharyngeal or retropharyngeal space inflammation or edema at this time. Salivary glands: Negative sublingual space. Submandibular glands and parotid glands remain within normal limits. Thyroid: Negative. Lymph nodes: Positive for enhancing and enlarged cervical lymph nodes bilaterally. Retropharyngeal lymph nodes are involved right slightly greater than left and individually measure up to 7 mm short axis. The largest nodes are at the level 2/level 3 junctions individually measuring 15-17 mm short axis. Numerous level 2 B lymph nodes are involved bilaterally. But no cystic or necrotic node is identified. Level 1 and level 4 are relatively spared. Vascular: Major vascular structures  in the neck and at the skull base remain patent including both internal jugular veins, the right is dominant. Tortuous distal cervical ICAs more so the right. Right vertebral artery also appears dominant. Limited intracranial: Negative. Visualized orbits: Negative. Mastoids and visualized paranasal sinuses: Scattered  bilateral paranasal sinus mucosal thickening and bubbly opacity. Nasal cavity involvement. Frontal sinuses are spared. Tympanic cavities and mastoids are clear. Skeleton: No dental abnormality identified. No osseous abnormality identified. Upper chest: Negative. Small volume residual thymus is within normal limits. Upper lungs are clear. IMPRESSION: 1. Acute Tonsillitis with reactive retropharyngeal and bilateral level 2/3 lymphadenopathy. 2. Asymmetric hypodensity in the left palatine tonsil (coronal image 49) suspicious for developing tonsillar abscess, although unlikely to be drainable at this time. No parapharyngeal or retropharyngeal space involvement. And no other complicating features. 3. Superimposed Acute Rhinosinusitis. Electronically Signed   By: Genevie Ann M.D.   On: 03/20/2021 04:50   CT ABDOMEN PELVIS W CONTRAST  Result Date: 03/19/2021 CLINICAL DATA:  Left lower quadrant abdominal pain, left flank pain EXAM: CT ABDOMEN AND PELVIS WITH CONTRAST TECHNIQUE: Multidetector CT imaging of the abdomen and pelvis was performed using the standard protocol following bolus administration of intravenous contrast. CONTRAST:  125mL OMNIPAQUE IOHEXOL 300 MG/ML  SOLN COMPARISON:  None. FINDINGS: Lower chest: No acute abnormality. Hepatobiliary: No focal liver abnormality is seen. No gallstones, gallbladder wall thickening, or biliary dilatation. Pancreas: Unremarkable Spleen: Unremarkable Adrenals/Urinary Tract: Adrenal glands are unremarkable. Kidneys are normal, without renal calculi, focal lesion, or hydronephrosis. Bladder is unremarkable. Stomach/Bowel: Stomach is within normal limits. Appendix appears normal. No evidence of bowel wall thickening, distention, or inflammatory changes. No free intraperitoneal gas. Vascular/Lymphatic: No significant vascular findings are present. No enlarged abdominal or pelvic lymph nodes. Reproductive: A macroscopic fat containing mass is again identified within the left ovary  compatible with an ovarian dermoid measuring 5.3 x 5.4 x 7.0 cm on axial image # 68 and sagittal reformat # 61. Trace free fluid within the pelvis may be physiologic in a female patient of this age. The pelvic organs are otherwise unremarkable. Other: No abdominal wall hernia.  The rectum is unremarkable. Musculoskeletal: No acute bone abnormality. No lytic or blastic bone lesions are identified. IMPRESSION: No acute intra-abdominal pathology identified. 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 As noted previously, this may predispose to torsion and gynecologic consultation may be helpful for further management. Electronically Signed   By: Fidela Salisbury MD   On: 03/19/2021 20:32   DG Chest Portable 1 View  Result Date: 03/19/2021 CLINICAL DATA:  Lower abdominal pain EXAM: PORTABLE CHEST 1 VIEW COMPARISON:  None. FINDINGS: The heart size and mediastinal contours are within normal limits. Both lungs are clear. The visualized skeletal structures are unremarkable. IMPRESSION: No active disease. Electronically Signed   By: Donavan Foil M.D.   On: 03/19/2021 17:17     Medical Consultants:   None.   Subjective:    Rance Muir vomitted twice overnight, zofran did not help  Objective:    Vitals:   03/20/21 1438 03/20/21 1826 03/20/21 2212 03/21/21 0445  BP: 100/65 101/67 113/73 105/71  Pulse: 86 89 83 93  Resp: 16 17 16 16   Temp: 98.1 F (36.7 C) 98.1 F (36.7 C) 98.2 F (36.8 C) 98.2 F (36.8 C)  TempSrc: Oral Oral Oral Oral  SpO2: 100% 100% 100% 99%  Weight:      Height:       SpO2: 99 %   Intake/Output Summary (Last 24 hours)  at 03/21/2021 0852 Last data filed at 03/21/2021 0528 Gross per 24 hour  Intake 3120 ml  Output --  Net 3120 ml   Filed Weights   03/19/21 2226  Weight: 58.5 kg    Exam: General exam: IIn no acute distress Respiratory system: good air movement and clear to auscultation. Purulent discharge from tonsil B/L Cardiovascular system: S1 & S2 heard, RRR. No  JVD. Gastrointestinal system: +BS soft and not tender. Extremities: No pedal edema. Skin: No rashes.  Data Reviewed:    Labs: Basic Metabolic Panel: Recent Labs  Lab 03/19/21 1543 03/20/21 0305 03/21/21 0102  NA 137 133* 137  K 3.4* 3.1* 4.4  CL 103 104 109  CO2 23 21* 24  GLUCOSE 96 97 143*  BUN 11 11 6   CREATININE 0.75 0.92 0.75  CALCIUM 9.0 7.7* 8.4*  MG  --  1.7  --     GFR Estimated Creatinine Clearance: 83 mL/min (by C-G formula based on SCr of 0.75 mg/dL). Liver Function Tests: Recent Labs  Lab 03/19/21 1543  AST 18  ALT 10  ALKPHOS 28*  BILITOT 0.6  PROT 7.3  ALBUMIN 4.2    Recent Labs  Lab 03/19/21 1543  LIPASE 14    No results for input(s): AMMONIA in the last 168 hours. Coagulation profile Recent Labs  Lab 03/20/21 0305  INR 1.4*    COVID-19 Labs  Recent Labs    03/20/21 0305  FERRITIN 46    Lab Results  Component Value Date   [place] NEGATIVE 03/19/2021    CBC: Recent Labs  Lab 03/19/21 1543 03/20/21 0305 03/21/21 0102  WBC 26.8* 20.2* 16.1*  NEUTROABS 25.1* 17.8* 13.6*  HGB 9.3* 7.5* 7.6*  HCT 29.4* 22.9* 24.5*  MCV 77.8* 76.6* 78.8*  PLT 352 243 283    Cardiac Enzymes: No results for input(s): CKTOTAL, CKMB, CKMBINDEX, TROPONINI in the last 168 hours. BNP (last 3 results) No results for input(s): PROBNP in the last 8760 hours. CBG: No results for input(s): GLUCAP in the last 168 hours. D-Dimer: No results for input(s): DDIMER in the last 72 hours. Hgb A1c: No results for input(s): HGBA1C in the last 72 hours. Lipid Profile: No results for input(s): CHOL, HDL, LDLCALC, TRIG, CHOLHDL, LDLDIRECT in the last 72 hours. Thyroid function studies: No results for input(s): TSH, T4TOTAL, T3FREE, THYROIDAB in the last 72 hours.  Invalid input(s): FREET3 Anemia work up: Recent Labs    03/20/21 0305  VITAMINB12 299  FOLATE 11.0  FERRITIN 46  TIBC 325  IRON 6*  RETICCTPCT 1.4   Sepsis Labs: Recent Labs   Lab 03/19/21 1543 03/19/21 1700 03/20/21 0305 03/21/21 0102  WBC 26.8*  --  20.2* 16.1*  LATICACIDVEN  --  0.8 0.7  --     Microbiology Recent Results (from the past 240 hour(s))  Resp Panel by RT-PCR (Flu A&B, Covid) Nasopharyngeal Swab     Status: None   Collection Time: 03/19/21  3:43 PM   Specimen: Nasopharyngeal Swab; Nasopharyngeal(NP) swabs in vial transport medium  Result Value Ref Range Status   SARS Coronavirus 2 by RT PCR NEGATIVE NEGATIVE Final    Comment: (NOTE) SARS-CoV-2 target nucleic acids are NOT DETECTED.  The SARS-CoV-2 RNA is generally detectable in upper respiratory specimens during the acute phase of infection. The lowest concentration of SARS-CoV-2 viral copies this assay can detect is 138 copies/mL. A negative result does not preclude SARS-Cov-2 infection and should not be used as the sole basis for treatment or  other patient management decisions. A negative result may occur with  improper specimen collection/handling, submission of specimen other than nasopharyngeal swab, presence of viral mutation(s) within the areas targeted by this assay, and inadequate number of viral copies(<138 copies/mL). A negative result must be combined with clinical observations, patient history, and epidemiological information. The expected result is Negative.  Fact Sheet for Patients:  EntrepreneurPulse.com.au  Fact Sheet for Healthcare Providers:  IncredibleEmployment.be  This test is no t yet approved or cleared by the [place] FDA and  has been authorized for detection and/or diagnosis of SARS-CoV-2 by FDA under an Emergency Use Authorization (EUA). This EUA will remain  in effect (meaning this test can be used) for the duration of the COVID-19 declaration under Section 564(b)(1) of the Act, 21 U.S.C.section 360bbb-3(b)(1), unless the authorization is terminated  or revoked sooner.       Influenza A by PCR NEGATIVE  NEGATIVE Final   Influenza B by PCR NEGATIVE NEGATIVE Final    Comment: (NOTE) The Xpert Xpress SARS-CoV-2/FLU/RSV plus assay is intended as an aid in the diagnosis of influenza from Nasopharyngeal swab specimens and should not be used as a sole basis for treatment. Nasal washings and aspirates are unacceptable for Xpert Xpress SARS-CoV-2/FLU/RSV testing.  Fact Sheet for Patients: EntrepreneurPulse.com.au  Fact Sheet for Healthcare Providers: IncredibleEmployment.be  This test is not yet approved or cleared by the [place] FDA and has been authorized for detection and/or diagnosis of SARS-CoV-2 by FDA under an Emergency Use Authorization (EUA). This EUA will remain in effect (meaning this test can be used) for the duration of the COVID-19 declaration under Section 564(b)(1) of the Act, 21 U.S.C. section 360bbb-3(b)(1), unless the authorization is terminated or revoked.  Performed at [facility], [address], [place], [place] [address]   Blood culture (routine x 2)     Status: None (Preliminary result)   Collection Time: 03/19/21  4:34 PM   Specimen: BLOOD RIGHT ARM  Result Value Ref Range Status   Specimen Description BLOOD RIGHT ARM  Final   Special Requests   Final    BOTTLES DRAWN AEROBIC AND ANAEROBIC Blood Culture adequate volume   Culture   Final    NO GROWTH 2 DAYS Performed at [facility], [place] [address] [place], [place] [address]    Report Status PENDING  Incomplete  Blood culture (routine x 2)     Status: None (Preliminary result)   Collection Time: 03/19/21  4:39 PM   Specimen: BLOOD LEFT WRIST  Result Value Ref Range Status   Specimen Description BLOOD LEFT WRIST  Final   Special Requests   Final    BOTTLES DRAWN AEROBIC AND ANAEROBIC Blood Culture adequate volume   Culture   Final    NO GROWTH 2 DAYS Performed at [facility], [place] [address] [place],  [address]    Report  Status PENDING  Incomplete  Group A Strep by PCR     Status: Abnormal   Collection Time: 03/19/21  9:39 PM   Specimen: Throat; Sterile Swab  Result Value Ref Range Status   Group A Strep by PCR DETECTED (A) NOT DETECTED Final    Comment: Performed at [facility], [place], [place] [address]     Medications:    ferrous sulfate  325 mg Oral Q breakfast   potassium chloride  40 mEq Oral BID   Continuous Infusions:  clindamycin (CLEOCIN) IV 900 mg (03/21/21 0522)  pencillin G potassium IV 4 Million Units (03/21/21 0813)   promethazine (PHENERGAN) injection (IM or IVPB)        LOS: 1 day   Charlynne Cousins  [facility]  03/21/2021, 8:52 AM

## 2021-03-22 ENCOUNTER — Other Ambulatory Visit (HOSPITAL_COMMUNITY): Payer: Self-pay

## 2021-03-22 MED ORDER — CEFDINIR 300 MG PO CAPS
300.0000 mg | ORAL_CAPSULE | Freq: Two times a day (BID) | ORAL | 0 refills | Status: AC
  Filled 2021-03-22: qty 14, 7d supply, fill #0

## 2021-03-22 MED ORDER — CLINDAMYCIN HCL 300 MG PO CAPS
300.0000 mg | ORAL_CAPSULE | Freq: Three times a day (TID) | ORAL | 0 refills | Status: AC
  Filled 2021-03-22: qty 15, 5d supply, fill #0

## 2021-03-22 MED ORDER — ONDANSETRON 4 MG PO TBDP
8.0000 mg | ORAL_TABLET | Freq: Three times a day (TID) | ORAL | 0 refills | Status: DC | PRN
  Filled 2021-03-22: qty 18, 21d supply, fill #0

## 2021-03-22 MED ORDER — FERROUS SULFATE 325 (65 FE) MG PO TABS
325.0000 mg | ORAL_TABLET | Freq: Every day | ORAL | 3 refills | Status: DC
  Filled 2021-03-22: qty 30, 30d supply, fill #0

## 2021-03-22 MED ORDER — CEFDINIR 300 MG PO CAPS
300.0000 mg | ORAL_CAPSULE | Freq: Two times a day (BID) | ORAL | Status: DC
  Filled 2021-03-22: qty 1

## 2021-03-22 MED ORDER — CLINDAMYCIN HCL 300 MG PO CAPS: 300.0000 mg | ORAL_CAPSULE | Freq: Three times a day (TID) | ORAL | Status: DC

## 2021-03-22 NOTE — Plan of Care (Signed)
  Problem: Education: Goal: Knowledge of General Education information will improve Description: Including pain rating scale, medication(s)/side effects and non-pharmacologic comfort measures 03/22/2021 1035 by Lurline Idol, RN Outcome: Adequate for Discharge 03/22/2021 1035 by Lurline Idol, RN Outcome: Adequate for Discharge   Problem: Health Behavior/Discharge Planning: Goal: Ability to manage health-related needs will improve 03/22/2021 1035 by Lurline Idol, RN Outcome: Adequate for Discharge 03/22/2021 1035 by Lurline Idol, RN Outcome: Adequate for Discharge   Problem: Clinical Measurements: Goal: Ability to maintain clinical measurements within normal limits will improve 03/22/2021 1035 by Lurline Idol, RN Outcome: Adequate for Discharge 03/22/2021 1035 by Lurline Idol, RN Outcome: Adequate for Discharge Goal: Will remain free from infection 03/22/2021 1035 by Lurline Idol, RN Outcome: Adequate for Discharge 03/22/2021 1035 by Lurline Idol, RN Outcome: Adequate for Discharge Goal: Diagnostic test results will improve 03/22/2021 1035 by Lurline Idol, RN Outcome: Adequate for Discharge 03/22/2021 1035 by Lurline Idol, RN Outcome: Adequate for Discharge Goal: Respiratory complications will improve 03/22/2021 1035 by Lurline Idol, RN Outcome: Adequate for Discharge 03/22/2021 1035 by Lurline Idol, RN Outcome: Adequate for Discharge Goal: Cardiovascular complication will be avoided 03/22/2021 1035 by Lurline Idol, RN Outcome: Adequate for Discharge 03/22/2021 1035 by Lurline Idol, RN Outcome: Adequate for Discharge   Problem: Activity: Goal: Risk for activity intolerance will decrease 03/22/2021 1035 by Lurline Idol, RN Outcome: Adequate for Discharge 03/22/2021 1035 by Lurline Idol, RN Outcome: Adequate for Discharge   Problem: Nutrition: Goal: Adequate nutrition will be maintained 03/22/2021 1035 by Lurline Idol, RN Outcome: Adequate for  Discharge 03/22/2021 1035 by Lurline Idol, RN Outcome: Adequate for Discharge   Problem: Coping: Goal: Level of anxiety will decrease 03/22/2021 1035 by Lurline Idol, RN Outcome: Adequate for Discharge 03/22/2021 1035 by Lurline Idol, RN Outcome: Adequate for Discharge   Problem: Elimination: Goal: Will not experience complications related to bowel motility 03/22/2021 1035 by Lurline Idol, RN Outcome: Adequate for Discharge 03/22/2021 1035 by Lurline Idol, RN Outcome: Adequate for Discharge Goal: Will not experience complications related to urinary retention 03/22/2021 1035 by Lurline Idol, RN Outcome: Adequate for Discharge 03/22/2021 1035 by Lurline Idol, RN Outcome: Adequate for Discharge   Problem: Pain Managment: Goal: General experience of comfort will improve 03/22/2021 1035 by Lurline Idol, RN Outcome: Adequate for Discharge 03/22/2021 1035 by Lurline Idol, RN Outcome: Adequate for Discharge   Problem: Safety: Goal: Ability to remain free from injury will improve 03/22/2021 1035 by Lurline Idol, RN Outcome: Adequate for Discharge 03/22/2021 1035 by Lurline Idol, RN Outcome: Adequate for Discharge   Problem: Skin Integrity: Goal: Risk for impaired skin integrity will decrease 03/22/2021 1035 by Lurline Idol, RN Outcome: Adequate for Discharge 03/22/2021 1035 by Lurline Idol, RN Outcome: Adequate for Discharge

## 2021-03-22 NOTE — Discharge Summary (Signed)
Physician Discharge Summary  Pamela Bates XBJ:[id number] DOB: 01/13/1991 DOA: 03/19/2021  PCP: Chesley Noon, MD  Admit date: 03/19/2021 Discharge date: 03/22/2021  Admitted From: Home Disposition:  Home  Recommendations for Outpatient Follow-up:  Follow up with PCP in 1-2 weeks Please obtain BMP/CBC in one week   Home Health:No Equipment/Devices:none  Discharge Condition:Stable CODE STATUS:Full Diet recommendation: Heart Healthy  Brief/Interim Summary: 30 y.o. female past medical history significant for menorrhagia anemia recurrent pharyngitis presents to the ED with shaking chills and sore throat pain with swallowing, SARS-CoV-2 PCR is negative, mild hypokalemia leukocytosis of 26,000 hemoglobin of 9.3 CT of the chest and pelvis showed no acute abnormalities.  Group A strep positive  Discharge Diagnoses:  Principal Problem:   Sepsis due to group A Streptococcus ([place]) Active Problems:   Microcytic anemia   Hypokalemia   Sepsis ([place])  Sepsis due to group a streptococcus: She was already empirically on ampicillin and clindamycin due to his severe enlargement and purulent drainage of tonsils and tongue. She was having a lot of intractable nausea which subsided. She was transitioned to oral cephalosporin and clindamycin which should continue as an outpatient for 7 more days.  Microcytic anemia: Menstruating female hemoglobin is down from 9.3-7.5 asymptomatic she was started on ferrous sulfate which should continue as an outpatient.  Hypokalemia: Likely due to dehydration and ongoing vomiting this was repleted now resolved.  Vaginal candidiasis: Copious amount of vaginal discharge with itching she was given a single dose Diflucan and this resolved.  Discharge Instructions  Discharge Instructions     Diet - low sodium heart healthy   Complete by: As directed    Increase activity slowly   Complete by: As directed       Allergies as of 03/22/2021   No Known  Allergies      Medication List     STOP taking these medications    fluticasone 50 MCG/ACT nasal spray Commonly known as: FLONASE       TAKE these medications    acetaminophen 500 MG tablet Commonly known as: TYLENOL Take 1 tablet (500 mg total) by mouth every 6 (six) hours as needed.   amphetamine-dextroamphetamine 10 MG tablet Commonly known as: ADDERALL Take 10-20 mg by mouth daily.   cefdinir 300 MG capsule Commonly known as: OMNICEF Take 1 capsule (300 mg total) by mouth every 12 (twelve) hours for 7 days.   cetirizine 10 MG tablet Commonly known as: ZYRTEC Take 10 mg by mouth daily as needed for allergies.   clindamycin 300 MG capsule Commonly known as: CLEOCIN Take 1 capsule (300 mg total) by mouth every 8 (eight) hours for 5 days.   ferrous sulfate 325 (65 FE) MG tablet Take 1 tablet (325 mg total) by mouth daily.   ibuprofen 600 MG tablet Commonly known as: ADVIL Take 1 tablet (600 mg total) by mouth every 6 (six) hours as needed.   ondansetron 4 MG disintegrating tablet Commonly known as: Zofran ODT Take 2 tablets (8 mg total) by mouth every 8 (eight) hours as needed for up to 5 doses for nausea or vomiting.        No Known Allergies  Consultations: None   Procedures/Studies: CT SOFT TISSUE NECK W CONTRAST  Result Date: 03/20/2021 CLINICAL DATA:  30 year old female with sore throat and suspected tonsillitis. EXAM: CT NECK WITH CONTRAST TECHNIQUE: Multidetector CT imaging of the neck was performed using the standard protocol following the bolus administration of intravenous contrast. CONTRAST:  39mL OMNIPAQUE  IOHEXOL 300 MG/ML  SOLN COMPARISON:  None. FINDINGS: Pharynx and larynx: Negative larynx. Epiglottis remains normal. Hypopharynx is distended with air, but otherwise normal. Symmetric enlargement and variegated hyperenhancement of the bilateral palatine tonsils. Asymmetric but indistinct left tonsillar hypodensity as seen on coronal image 49.  Superimposed generalized oropharyngeal mucosal hyperenhancement. Less enlargement but similar variegated hyperenhancement of the adenoids. No convincing parapharyngeal or retropharyngeal space inflammation or edema at this time. Salivary glands: Negative sublingual space. Submandibular glands and parotid glands remain within normal limits. Thyroid: Negative. Lymph nodes: Positive for enhancing and enlarged cervical lymph nodes bilaterally. Retropharyngeal lymph nodes are involved right slightly greater than left and individually measure up to 7 mm short axis. The largest nodes are at the level 2/level 3 junctions individually measuring 15-17 mm short axis. Numerous level 2 B lymph nodes are involved bilaterally. But no cystic or necrotic node is identified. Level 1 and level 4 are relatively spared. Vascular: Major vascular structures in the neck and at the skull base remain patent including both internal jugular veins, the right is dominant. Tortuous distal cervical ICAs more so the right. Right vertebral artery also appears dominant. Limited intracranial: Negative. Visualized orbits: Negative. Mastoids and visualized paranasal sinuses: Scattered bilateral paranasal sinus mucosal thickening and bubbly opacity. Nasal cavity involvement. Frontal sinuses are spared. Tympanic cavities and mastoids are clear. Skeleton: No dental abnormality identified. No osseous abnormality identified. Upper chest: Negative. Small volume residual thymus is within normal limits. Upper lungs are clear. IMPRESSION: 1. Acute Tonsillitis with reactive retropharyngeal and bilateral level 2/3 lymphadenopathy. 2. Asymmetric hypodensity in the left palatine tonsil (coronal image 49) suspicious for developing tonsillar abscess, although unlikely to be drainable at this time. No parapharyngeal or retropharyngeal space involvement. And no other complicating features. 3. Superimposed Acute Rhinosinusitis. Electronically Signed   By: Genevie Ann M.D.    On: 03/20/2021 04:50   CT ABDOMEN PELVIS W CONTRAST  Result Date: 03/19/2021 CLINICAL DATA:  Left lower quadrant abdominal pain, left flank pain EXAM: CT ABDOMEN AND PELVIS WITH CONTRAST TECHNIQUE: Multidetector CT imaging of the abdomen and pelvis was performed using the standard protocol following bolus administration of intravenous contrast. CONTRAST:  111mL OMNIPAQUE IOHEXOL 300 MG/ML  SOLN COMPARISON:  None. FINDINGS: Lower chest: No acute abnormality. Hepatobiliary: No focal liver abnormality is seen. No gallstones, gallbladder wall thickening, or biliary dilatation. Pancreas: Unremarkable Spleen: Unremarkable Adrenals/Urinary Tract: Adrenal glands are unremarkable. Kidneys are normal, without renal calculi, focal lesion, or hydronephrosis. Bladder is unremarkable. Stomach/Bowel: Stomach is within normal limits. Appendix appears normal. No evidence of bowel wall thickening, distention, or inflammatory changes. No free intraperitoneal gas. Vascular/Lymphatic: No significant vascular findings are present. No enlarged abdominal or pelvic lymph nodes. Reproductive: A macroscopic fat containing mass is again identified within the left ovary compatible with an ovarian dermoid measuring 5.3 x 5.4 x 7.0 cm on axial image # 68 and sagittal reformat # 61. Trace free fluid within the pelvis may be physiologic in a female patient of this age. The pelvic organs are otherwise unremarkable. Other: No abdominal wall hernia.  The rectum is unremarkable. Musculoskeletal: No acute bone abnormality. No lytic or blastic bone lesions are identified. IMPRESSION: No acute intra-abdominal pathology identified. 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 As noted previously, this may predispose to torsion and gynecologic consultation may be helpful for further management. Electronically Signed   By: Fidela Salisbury MD   On: 03/19/2021 20:32   DG Chest Portable 1 View  Result Date: 03/19/2021 CLINICAL DATA:  Lower abdominal pain EXAM:  PORTABLE CHEST 1 VIEW COMPARISON:  None. FINDINGS: The heart size and mediastinal contours are within normal limits. Both lungs are clear. The visualized skeletal structures are unremarkable. IMPRESSION: No active disease. Electronically Signed   By: Donavan Foil M.D.   On: 03/19/2021 17:17   (Echo, Carotid, EGD, Colonoscopy, ERCP)    Subjective: No complaints  Discharge Exam: Vitals:   03/22/21 0418 03/22/21 0807  BP: 109/67 114/78  Pulse: 88 79  Resp: 17 19  Temp: 97.7 F (36.5 C) 97.7 F (36.5 C)  SpO2: 97% 98%   Vitals:   03/21/21 1419 03/21/21 2011 03/22/21 0418 03/22/21 0807  BP: 110/71 107/71 109/67 114/78  Pulse: 87 89 88 79  Resp: 16 17 17 19   Temp: 98.1 F (36.7 C) 98.4 F (36.9 C) 97.7 F (36.5 C) 97.7 F (36.5 C)  TempSrc: Oral Oral Oral Oral  SpO2: 99% 100% 97% 98%  Weight:      Height:        General: Pt is alert, awake, not in acute distress Cardiovascular: RRR, S1/S2 +, no rubs, no gallops Respiratory: CTA bilaterally, no wheezing, no rhonchi Abdominal: Soft, NT, ND, bowel sounds + Extremities: no edema, no cyanosis    The results of significant diagnostics from this hospitalization (including imaging, microbiology, ancillary and laboratory) are listed below for reference.     Microbiology: Recent Results (from the past 240 hour(s))  Resp Panel by RT-PCR (Flu A&B, Covid) Nasopharyngeal Swab     Status: None   Collection Time: 03/19/21  3:43 PM   Specimen: Nasopharyngeal Swab; Nasopharyngeal(NP) swabs in vial transport medium  Result Value Ref Range Status   SARS Coronavirus 2 by RT PCR NEGATIVE NEGATIVE Final    Comment: (NOTE) SARS-CoV-2 target nucleic acids are NOT DETECTED.  The SARS-CoV-2 RNA is generally detectable in upper respiratory specimens during the acute phase of infection. The lowest concentration of SARS-CoV-2 viral copies this assay can detect is 138 copies/mL. A negative result does not preclude SARS-Cov-2 infection and  should not be used as the sole basis for treatment or other patient management decisions. A negative result may occur with  improper specimen collection/handling, submission of specimen other than nasopharyngeal swab, presence of viral mutation(s) within the areas targeted by this assay, and inadequate number of viral copies(<138 copies/mL). A negative result must be combined with clinical observations, patient history, and epidemiological information. The expected result is Negative.  Fact Sheet for Patients:  EntrepreneurPulse.com.au  Fact Sheet for Healthcare Providers:  IncredibleEmployment.be  This test is no t yet approved or cleared by the [place] FDA and  has been authorized for detection and/or diagnosis of SARS-CoV-2 by FDA under an Emergency Use Authorization (EUA). This EUA will remain  in effect (meaning this test can be used) for the duration of the COVID-19 declaration under Section 564(b)(1) of the Act, 21 U.S.C.section 360bbb-3(b)(1), unless the authorization is terminated  or revoked sooner.       Influenza A by PCR NEGATIVE NEGATIVE Final   Influenza B by PCR NEGATIVE NEGATIVE Final    Comment: (NOTE) The Xpert Xpress SARS-CoV-2/FLU/RSV plus assay is intended as an aid in the diagnosis of influenza from Nasopharyngeal swab specimens and should not be used as a sole basis for treatment. Nasal washings and aspirates are unacceptable for Xpert Xpress SARS-CoV-2/FLU/RSV testing.  Fact Sheet for Patients: EntrepreneurPulse.com.au  Fact Sheet for Healthcare Providers: IncredibleEmployment.be  This test is not yet approved or cleared by the  [place] Architectural technologist and has been authorized for detection and/or diagnosis of SARS-CoV-2 by FDA under an Print production planner (EUA). This EUA will remain in effect (meaning this test can be used) for the duration of the COVID-19 declaration  under Section 564(b)(1) of the Act, 21 U.S.C. section 360bbb-3(b)(1), unless the authorization is terminated or revoked.  Performed at [facility], [address], [place], [place] [address]   Blood culture (routine x 2)     Status: None (Preliminary result)   Collection Time: 03/19/21  4:34 PM   Specimen: BLOOD RIGHT ARM  Result Value Ref Range Status   Specimen Description BLOOD RIGHT ARM  Final   Special Requests   Final    BOTTLES DRAWN AEROBIC AND ANAEROBIC Blood Culture adequate volume   Culture   Final    NO GROWTH 3 DAYS Performed at [facility], [place] [address], [place], [place] [address]    Report Status PENDING  Incomplete  Blood culture (routine x 2)     Status: None (Preliminary result)   Collection Time: 03/19/21  4:39 PM   Specimen: BLOOD LEFT WRIST  Result Value Ref Range Status   Specimen Description BLOOD LEFT WRIST  Final   Special Requests   Final    BOTTLES DRAWN AEROBIC AND ANAEROBIC Blood Culture adequate volume   Culture   Final    NO GROWTH 3 DAYS Performed at [facility], [place] [address], [place], [place] [address]    Report Status PENDING  Incomplete  Group A Strep by PCR     Status: Abnormal   Collection Time: 03/19/21  9:39 PM   Specimen: Throat; Sterile Swab  Result Value Ref Range Status   Group A Strep by PCR DETECTED (A) NOT DETECTED Final    Comment: Performed at [facility], [address], [place], [place] [address]     Labs: BNP (last 3 results) No results for input(s): BNP in the last 8760 hours. Basic Metabolic Panel: Recent Labs  Lab 03/19/21 1543 03/20/21 0305 03/21/21 0102  NA 137 133* 137  K 3.4* 3.1* 4.4  CL 103 104 109  CO2 23 21* 24  GLUCOSE 96 97 143*  BUN 11 11 6   CREATININE 0.75 0.92 0.75  CALCIUM 9.0 7.7* 8.4*  MG  --  1.7  --    Liver Function Tests: Recent Labs  Lab 03/19/21 1543  AST 18  ALT 10  ALKPHOS 28*  BILITOT 0.6  PROT 7.3  ALBUMIN 4.2    Recent Labs  Lab 03/19/21 1543  LIPASE 14   No results for input(s): AMMONIA in the last 168 hours. CBC: Recent Labs  Lab 03/19/21 1543 03/20/21 0305 03/21/21 0102  WBC 26.8* 20.2* 16.1*  NEUTROABS 25.1* 17.8* 13.6*  HGB 9.3* 7.5* 7.6*  HCT 29.4* 22.9* 24.5*  MCV 77.8* 76.6* 78.8*  PLT 352 243 283   Cardiac Enzymes: No results for input(s): CKTOTAL, CKMB, CKMBINDEX, TROPONINI in the last 168 hours. BNP: Invalid input(s): POCBNP CBG: No results for input(s): GLUCAP in the last 168 hours. D-Dimer No results for input(s): DDIMER in the last 72 hours. Hgb A1c No results for input(s): HGBA1C in the last 72 hours. Lipid Profile No results for input(s): CHOL, HDL, LDLCALC, TRIG, CHOLHDL, LDLDIRECT in the last 72 hours. Thyroid function studies No results for input(s): TSH, T4TOTAL, T3FREE, THYROIDAB in the last 72 hours.  Invalid input(s): FREET3 Anemia work up [employer]    03/20/21  0305  VITAMINB12 299  FOLATE 11.0  FERRITIN 46  TIBC 325  IRON 6*  RETICCTPCT 1.4   Urinalysis    Component Value Date/Time   COLORURINE YELLOW 03/19/2021 1522   APPEARANCEUR CLEAR 03/19/2021 1522   LABSPEC 1.032 (H) 03/19/2021 1522   PHURINE 6.0 03/19/2021 1522   GLUCOSEU NEGATIVE 03/19/2021 1522   HGBUR NEGATIVE 03/19/2021 1522   BILIRUBINUR NEGATIVE 03/19/2021 1522   KETONESUR >80 (A) 03/19/2021 1522   PROTEINUR 30 (A) 03/19/2021 1522   NITRITE NEGATIVE 03/19/2021 1522   LEUKOCYTESUR TRACE (A) 03/19/2021 1522   Sepsis Labs Invalid input(s): PROCALCITONIN,  WBC,  LACTICIDVEN Microbiology Recent Results (from the past 240 hour(s))  Resp Panel by RT-PCR (Flu A&B, Covid) Nasopharyngeal Swab     Status: None   Collection Time: 03/19/21  3:43 PM   Specimen: Nasopharyngeal Swab; Nasopharyngeal(NP) swabs in vial transport medium  Result Value Ref Range Status   SARS Coronavirus 2 by RT PCR NEGATIVE NEGATIVE Final    Comment: (NOTE) SARS-CoV-2 target nucleic acids are NOT  DETECTED.  The SARS-CoV-2 RNA is generally detectable in upper respiratory specimens during the acute phase of infection. The lowest concentration of SARS-CoV-2 viral copies this assay can detect is 138 copies/mL. A negative result does not preclude SARS-Cov-2 infection and should not be used as the sole basis for treatment or other patient management decisions. A negative result may occur with  improper specimen collection/handling, submission of specimen other than nasopharyngeal swab, presence of viral mutation(s) within the areas targeted by this assay, and inadequate number of viral copies(<138 copies/mL). A negative result must be combined with clinical observations, patient history, and epidemiological information. The expected result is Negative.  Fact Sheet for Patients:  EntrepreneurPulse.com.au  Fact Sheet for Healthcare Providers:  IncredibleEmployment.be  This test is no t yet approved or cleared by the [place] FDA and  has been authorized for detection and/or diagnosis of SARS-CoV-2 by FDA under an Emergency Use Authorization (EUA). This EUA will remain  in effect (meaning this test can be used) for the duration of the COVID-19 declaration under Section 564(b)(1) of the Act, 21 U.S.C.section 360bbb-3(b)(1), unless the authorization is terminated  or revoked sooner.       Influenza A by PCR NEGATIVE NEGATIVE Final   Influenza B by PCR NEGATIVE NEGATIVE Final    Comment: (NOTE) The Xpert Xpress SARS-CoV-2/FLU/RSV plus assay is intended as an aid in the diagnosis of influenza from Nasopharyngeal swab specimens and should not be used as a sole basis for treatment. Nasal washings and aspirates are unacceptable for Xpert Xpress SARS-CoV-2/FLU/RSV testing.  Fact Sheet for Patients: EntrepreneurPulse.com.au  Fact Sheet for Healthcare Providers: IncredibleEmployment.be  This test is not yet  approved or cleared by the [place] FDA and has been authorized for detection and/or diagnosis of SARS-CoV-2 by FDA under an Emergency Use Authorization (EUA). This EUA will remain in effect (meaning this test can be used) for the duration of the COVID-19 declaration under Section 564(b)(1) of the Act, 21 U.S.C. section 360bbb-3(b)(1), unless the authorization is terminated or revoked.  Performed at [facility], [address] [place], [place] [address]   Blood culture (routine x 2)     Status: None (Preliminary result)   Collection Time: 03/19/21  4:34 PM   Specimen: BLOOD RIGHT ARM  Result Value Ref Range Status   Specimen Description BLOOD RIGHT ARM  Final   Special Requests   Final    BOTTLES DRAWN AEROBIC AND  ANAEROBIC Blood Culture adequate volume   Culture   Final    NO GROWTH 3 DAYS Performed at [facility], [place] [address], [place], [place] [address]    Report Status PENDING  Incomplete  Blood culture (routine x 2)     Status: None (Preliminary result)   Collection Time: 03/19/21  4:39 PM   Specimen: BLOOD LEFT WRIST  Result Value Ref Range Status   Specimen Description BLOOD LEFT WRIST  Final   Special Requests   Final    BOTTLES DRAWN AEROBIC AND ANAEROBIC Blood Culture adequate volume   Culture   Final    NO GROWTH 3 DAYS Performed at [facility], [place] [address] [place], [place] [address]    Report Status PENDING  Incomplete  Group A Strep by PCR     Status: Abnormal   Collection Time: 03/19/21  9:39 PM   Specimen: Throat; Sterile Swab  Result Value Ref Range Status   Group A Strep by PCR DETECTED (A) NOT DETECTED Final    Comment: Performed at [facility], [address], [place], [place] [address]     Time coordinating discharge: Over 30 minutes  SIGNED:   Charlynne Cousins, MD  [facility] 03/22/2021, 9:46 AM Pager   If 7PM-7AM, please contact  night-coverage [email or web address] Password [email or web address]

## 2021-03-22 NOTE — Progress Notes (Signed)
Patient given discharge instructions and stated understanding, she has her TOC perscriptions and mother is here to drive her home, leaving via wheelchair.

## 2021-03-24 LAB — CULTURE, BLOOD (ROUTINE X 2)
Culture: NO GROWTH
Culture: NO GROWTH
Special Requests: ADEQUATE
Special Requests: ADEQUATE

## 2021-04-05 DIAGNOSIS — G4700 Insomnia, unspecified: Secondary | ICD-10-CM | POA: Diagnosis not present

## 2021-04-05 DIAGNOSIS — G47419 Narcolepsy without cataplexy: Secondary | ICD-10-CM | POA: Diagnosis not present

## 2021-04-11 DIAGNOSIS — J020 Streptococcal pharyngitis: Secondary | ICD-10-CM | POA: Diagnosis not present

## 2021-04-12 DIAGNOSIS — J302 Other seasonal allergic rhinitis: Secondary | ICD-10-CM | POA: Diagnosis not present

## 2021-04-12 DIAGNOSIS — Z8709 Personal history of other diseases of the respiratory system: Secondary | ICD-10-CM | POA: Diagnosis not present

## 2021-04-12 DIAGNOSIS — J330 Polyp of nasal cavity: Secondary | ICD-10-CM | POA: Diagnosis not present

## 2021-04-25 ENCOUNTER — Other Ambulatory Visit: Payer: Self-pay

## 2021-04-25 ENCOUNTER — Ambulatory Visit (HOSPITAL_BASED_OUTPATIENT_CLINIC_OR_DEPARTMENT_OTHER): Payer: BC Managed Care – PPO | Admitting: Anesthesiology

## 2021-04-25 ENCOUNTER — Encounter (HOSPITAL_BASED_OUTPATIENT_CLINIC_OR_DEPARTMENT_OTHER): Payer: Self-pay | Admitting: Otolaryngology

## 2021-04-25 ENCOUNTER — Encounter (HOSPITAL_BASED_OUTPATIENT_CLINIC_OR_DEPARTMENT_OTHER)
Admission: RE | Disposition: A | Discharge status: Home / Self Care | Payer: Self-pay | Source: Home / Self Care | Attending: Otolaryngology

## 2021-04-25 ENCOUNTER — Ambulatory Visit (HOSPITAL_BASED_OUTPATIENT_CLINIC_OR_DEPARTMENT_OTHER)
Admission: RE | Admit: 2021-04-25 | Discharge: 2021-04-25 | Disposition: A | Discharge status: Home / Self Care | Payer: BC Managed Care – PPO | Attending: Otolaryngology | Admitting: Otolaryngology

## 2021-04-25 DIAGNOSIS — E876 Hypokalemia: Secondary | ICD-10-CM | POA: Diagnosis not present

## 2021-04-25 DIAGNOSIS — A400 Sepsis due to streptococcus, group A: Secondary | ICD-10-CM | POA: Diagnosis not present

## 2021-04-25 DIAGNOSIS — J339 Nasal polyp, unspecified: Secondary | ICD-10-CM | POA: Diagnosis not present

## 2021-04-25 DIAGNOSIS — Z9089 Acquired absence of other organs: Secondary | ICD-10-CM

## 2021-04-25 DIAGNOSIS — D509 Iron deficiency anemia, unspecified: Secondary | ICD-10-CM | POA: Diagnosis not present

## 2021-04-25 DIAGNOSIS — J45990 Exercise induced bronchospasm: Secondary | ICD-10-CM | POA: Diagnosis not present

## 2021-04-25 DIAGNOSIS — J020 Streptococcal pharyngitis: Secondary | ICD-10-CM | POA: Diagnosis not present

## 2021-04-25 DIAGNOSIS — J0301 Acute recurrent streptococcal tonsillitis: Secondary | ICD-10-CM | POA: Insufficient documentation

## 2021-04-25 DIAGNOSIS — Z79899 Other long term (current) drug therapy: Secondary | ICD-10-CM | POA: Insufficient documentation

## 2021-04-25 HISTORY — DX: Narcolepsy without cataplexy: G47.419

## 2021-04-25 HISTORY — PX: TONSILLECTOMY: SHX5217

## 2021-04-25 LAB — POCT PREGNANCY, URINE: Preg Test, Ur: NEGATIVE

## 2021-04-25 SURGERY — TONSILLECTOMY
Anesthesia: General | Laterality: Bilateral

## 2021-04-25 SURGERY — TONSILLECTOMY
Anesthesia: General | Site: Mouth

## 2021-04-25 MED ORDER — FENTANYL CITRATE (PF) 100 MCG/2ML IJ SOLN
INTRAMUSCULAR | Status: DC | PRN
  Administered 2021-04-25 (×2): 50 ug via INTRAVENOUS

## 2021-04-25 MED ORDER — LIDOCAINE HCL (CARDIAC) PF 100 MG/5ML IV SOSY
PREFILLED_SYRINGE | INTRAVENOUS | Status: DC | PRN
  Administered 2021-04-25: 140 mg via INTRAVENOUS

## 2021-04-25 MED ORDER — ACETAMINOPHEN 325 MG PO TABS: 325.0000 mg | ORAL_TABLET | ORAL | Status: DC | PRN

## 2021-04-25 MED ORDER — FENTANYL CITRATE (PF) 100 MCG/2ML IJ SOLN
INTRAMUSCULAR | Status: AC
  Filled 2021-04-25: qty 2

## 2021-04-25 MED ORDER — PHENOL 1.4 % MT LIQD: 1.0000 | OROMUCOSAL | Status: DC | PRN

## 2021-04-25 MED ORDER — FENTANYL CITRATE (PF) 100 MCG/2ML IJ SOLN
25.0000 ug | INTRAMUSCULAR | Status: DC | PRN
  Administered 2021-04-25: 50 ug via INTRAVENOUS

## 2021-04-25 MED ORDER — ACETAMINOPHEN 160 MG/5ML PO SOLN: 325.0000 mg | ORAL | Status: DC | PRN

## 2021-04-25 MED ORDER — DEXAMETHASONE SODIUM PHOSPHATE 4 MG/ML IJ SOLN
INTRAMUSCULAR | Status: DC | PRN
  Administered 2021-04-25: 10 mg via INTRAVENOUS

## 2021-04-25 MED ORDER — DEXAMETHASONE SODIUM PHOSPHATE 10 MG/ML IJ SOLN
INTRAMUSCULAR | Status: AC
  Filled 2021-04-25: qty 1

## 2021-04-25 MED ORDER — AMPHETAMINE-DEXTROAMPHETAMINE 10 MG PO TABS: 10.0000 mg | ORAL_TABLET | Freq: Every day | ORAL | Status: DC

## 2021-04-25 MED ORDER — SCOPOLAMINE 1 MG/3DAYS TD PT72
1.0000 | MEDICATED_PATCH | TRANSDERMAL | Status: DC
  Administered 2021-04-25: 1 via TRANSDERMAL

## 2021-04-25 MED ORDER — AMISULPRIDE (ANTIEMETIC) 5 MG/2ML IV SOLN: 10.0000 mg | Freq: Once | INTRAVENOUS | Status: DC | PRN

## 2021-04-25 MED ORDER — 0.9 % SODIUM CHLORIDE (POUR BTL) OPTIME
TOPICAL | Status: DC | PRN
  Administered 2021-04-25: 100 mL

## 2021-04-25 MED ORDER — OXYCODONE HCL 5 MG/5ML PO SOLN
ORAL | Status: AC
  Filled 2021-04-25: qty 5

## 2021-04-25 MED ORDER — ROCURONIUM BROMIDE 100 MG/10ML IV SOLN
INTRAVENOUS | Status: DC | PRN
  Administered 2021-04-25: 40 mg via INTRAVENOUS

## 2021-04-25 MED ORDER — PROPOFOL 10 MG/ML IV BOLUS
INTRAVENOUS | Status: AC
  Filled 2021-04-25: qty 20

## 2021-04-25 MED ORDER — LACTATED RINGERS IV SOLN: INTRAVENOUS | Status: DC

## 2021-04-25 MED ORDER — ONDANSETRON 4 MG PO TBDP: 8.0000 mg | ORAL_TABLET | Freq: Three times a day (TID) | ORAL | Status: DC | PRN

## 2021-04-25 MED ORDER — HYDROCODONE-ACETAMINOPHEN 7.5-325 MG/15ML PO SOLN: 15.0000 mL | Freq: Four times a day (QID) | ORAL | 0 refills | Status: DC | PRN

## 2021-04-25 MED ORDER — OXYCODONE HCL 5 MG/5ML PO SOLN
5.0000 mg | Freq: Once | ORAL | Status: AC | PRN
Start: 2021-04-25 — End: 2021-04-25
  Administered 2021-04-25: 5 mg via ORAL

## 2021-04-25 MED ORDER — ONDANSETRON 8 MG PO TBDP: 8.0000 mg | ORAL_TABLET | Freq: Three times a day (TID) | ORAL | 0 refills | Status: DC | PRN

## 2021-04-25 MED ORDER — DEXTROSE-NACL 5-0.9 % IV SOLN: INTRAVENOUS | Status: DC

## 2021-04-25 MED ORDER — FERROUS SULFATE 325 (65 FE) MG PO TABS: 325.0000 mg | ORAL_TABLET | Freq: Every day | ORAL | Status: DC

## 2021-04-25 MED ORDER — MIDAZOLAM HCL 5 MG/5ML IJ SOLN
INTRAMUSCULAR | Status: DC | PRN
  Administered 2021-04-25: 2 mg via INTRAVENOUS

## 2021-04-25 MED ORDER — SUGAMMADEX SODIUM 200 MG/2ML IV SOLN
INTRAVENOUS | Status: DC | PRN
  Administered 2021-04-25: 200 mg via INTRAVENOUS

## 2021-04-25 MED ORDER — MIDAZOLAM HCL 2 MG/2ML IJ SOLN
INTRAMUSCULAR | Status: AC
  Filled 2021-04-25: qty 2

## 2021-04-25 MED ORDER — ACETAMINOPHEN 10 MG/ML IV SOLN: 1000.0000 mg | Freq: Once | INTRAVENOUS | Status: DC | PRN

## 2021-04-25 MED ORDER — PROMETHAZINE HCL 25 MG/ML IJ SOLN: 6.2500 mg | INTRAMUSCULAR | Status: DC | PRN

## 2021-04-25 MED ORDER — ONDANSETRON HCL 4 MG/2ML IJ SOLN
INTRAMUSCULAR | Status: AC
  Filled 2021-04-25: qty 2

## 2021-04-25 MED ORDER — LIDOCAINE HCL (PF) 2 % IJ SOLN
INTRAMUSCULAR | Status: AC
  Filled 2021-04-25: qty 5

## 2021-04-25 MED ORDER — OXYCODONE HCL 5 MG PO TABS: 5.0000 mg | ORAL_TABLET | Freq: Once | ORAL | Status: AC | PRN

## 2021-04-25 MED ORDER — IBUPROFEN 600 MG PO TABS: 600.0000 mg | ORAL_TABLET | Freq: Four times a day (QID) | ORAL | Status: DC | PRN

## 2021-04-25 MED ORDER — ONDANSETRON HCL 4 MG/2ML IJ SOLN
INTRAMUSCULAR | Status: DC | PRN
  Administered 2021-04-25: 4 mg via INTRAVENOUS

## 2021-04-25 MED ORDER — HYDROCODONE-ACETAMINOPHEN 7.5-325 MG/15ML PO SOLN: 10.0000 mL | ORAL | Status: DC | PRN

## 2021-04-25 SURGICAL SUPPLY — 25 items
CANISTER SUCT 1200ML W/VALVE (MISCELLANEOUS) ×2 IMPLANT
CATH ROBINSON RED A/P 12FR (CATHETERS) ×2 IMPLANT
COAGULATOR SUCT SWTCH 10FR 6 (ELECTROSURGICAL) ×2 IMPLANT
COVER BACK TABLE 60X90IN (DRAPES) ×2 IMPLANT
COVER MAYO STAND STRL (DRAPES) ×2 IMPLANT
DEFOGGER MIRROR 1QT (MISCELLANEOUS) IMPLANT
ELECT COATED BLADE 2.86 ST (ELECTRODE) ×2 IMPLANT
ELECT REM PT RETURN 9FT ADLT (ELECTROSURGICAL) ×2
ELECTRODE REM PT RTRN 9FT ADLT (ELECTROSURGICAL) IMPLANT
GAUZE SPONGE 4X4 12PLY STRL LF (GAUZE/BANDAGES/DRESSINGS) ×2 IMPLANT
GLOVE SURG LTX SZ7.5 (GLOVE) ×2 IMPLANT
GLOVE SURG POLYISO LF SZ7 (GLOVE) ×1 IMPLANT
GLOVE SURG UNDER POLY LF SZ7 (GLOVE) ×1 IMPLANT
GOWN STRL REUS W/ TWL LRG LVL3 (GOWN DISPOSABLE) ×2 IMPLANT
GOWN STRL REUS W/TWL LRG LVL3 (GOWN DISPOSABLE) ×4
MARKER SKIN DUAL TIP RULER LAB (MISCELLANEOUS) IMPLANT
NS IRRIG 1000ML POUR BTL (IV SOLUTION) ×2 IMPLANT
PENCIL FOOT CONTROL (ELECTRODE) ×2 IMPLANT
SHEET MEDIUM DRAPE 40X70 STRL (DRAPES) ×2 IMPLANT
SPONGE TONSIL 1.25 RF SGL STRG (GAUZE/BANDAGES/DRESSINGS) ×2 IMPLANT
SYR BULB EAR ULCER 3OZ GRN STR (SYRINGE) ×2 IMPLANT
TOWEL GREEN STERILE FF (TOWEL DISPOSABLE) ×2 IMPLANT
TUBE CONNECTING 20X1/4 (TUBING) ×2 IMPLANT
TUBE SALEM SUMP 12R W/ARV (TUBING) IMPLANT
TUBE SALEM SUMP 16 FR W/ARV (TUBING) ×1 IMPLANT

## 2021-04-25 NOTE — Transfer of Care (Signed)
Immediate Anesthesia Transfer of Care Note  Patient: Pamela Bates  Procedure(s) Performed: TONSILLECTOMY (Mouth)  Patient Location: PACU  Anesthesia Type:General  Level of Consciousness: sedated  Airway & Oxygen Therapy: Patient Spontanous Breathing and Patient connected to face mask oxygen  Post-op Assessment: Report given to RN and Post -op Vital signs reviewed and stable  Post vital signs: Reviewed and stable  Last Vitals:  Vitals Value Taken Time  BP 118/77 04/25/21 1005  Temp    Pulse 100 04/25/21 1007  Resp 10 04/25/21 1007  SpO2 100 % 04/25/21 1007  Vitals shown include unvalidated device data.  Last Pain:  Vitals:   04/25/21 0755  TempSrc: Oral  PainSc: 0-No pain         Complications: No notable events documented.

## 2021-04-25 NOTE — Anesthesia Preprocedure Evaluation (Addendum)
Anesthesia Evaluation  Patient identified by MRN, date of birth, ID band Patient awake    Reviewed: Allergy & Precautions, NPO status , Patient's Chart, lab work & pertinent test results  Airway Mallampati: II  TM Distance: >3 FB Neck ROM: Full    Dental  (+) Teeth Intact, Dental Advisory Given   Pulmonary    breath sounds clear to auscultation       Cardiovascular negative cardio ROS   Rhythm:Regular Rate:Normal     Neuro/Psych negative neurological ROS  negative psych ROS   GI/Hepatic negative GI ROS, Neg liver ROS,   Endo/Other    Renal/GU negative Renal ROS     Musculoskeletal negative musculoskeletal ROS (+)   Abdominal Normal abdominal exam  (+)   Peds  Hematology   Anesthesia Other Findings   Reproductive/Obstetrics                           Anesthesia Physical Anesthesia Plan  ASA: 2  Anesthesia Plan: General   Post-op Pain Management:    Induction: Intravenous  PONV Risk Score and Plan: 4 or greater and Ondansetron, Dexamethasone, Midazolam and Scopolamine patch - Pre-op  Airway Management Planned: Oral ETT  Additional Equipment: None  Intra-op Plan:   Post-operative Plan: Extubation in OR  Informed Consent: I have reviewed the patients History and Physical, chart, labs and discussed the procedure including the risks, benefits and alternatives for the proposed anesthesia with the patient or authorized representative who has indicated his/her understanding and acceptance.     Dental advisory given  Plan Discussed with: CRNA  Anesthesia Plan Comments:        Anesthesia Quick Evaluation

## 2021-04-25 NOTE — Op Note (Signed)
04/25/2021  9:54 AM  PATIENT:  Pamela Bates  30 y.o. female  PRE-OPERATIVE DIAGNOSIS:  RECURRENT STREPTOCOCCHAL TONSILLITIS  POST-OPERATIVE DIAGNOSIS:  RECURRENT STREPTOCOCCHAL TONSILLITIS  PROCEDURE:  Procedure(s): TONSILLECTOMY  SURGEON:  Surgeon(s): Izora Gala, MD  ANESTHESIA:   General  COUNTS: Correct  EBL: 100cc  DICTATION: The patient was taken to the operating room and placed on the operating table in the supine position. Following induction of general endotracheal anesthesia, the table was turned and the patient was draped in a standard fashion. A Crowe-Davis mouthgag was inserted into the oral cavity and used to retract the tongue and mandible, then attached to the Mayo stand.  The tonsillectomy was then performed using electrocautery dissection, carefully dissecting the avascular plane between the capsule and constrictor muscles. Cautery was used for completion of hemostasis. The tonsils were large , and were discarded.  The pharynx was irrigated with saline and suctioned. An oral gastric tube was used to aspirate the contents of the stomach. The patient was then awakened from anesthesia and transferred to PACU in stable condition.   PATIENT DISPOSITION:  To PACA, stable

## 2021-04-25 NOTE — Anesthesia Procedure Notes (Signed)
Procedure Name: Intubation Date/Time: 04/25/2021 9:27 AM Performed by: Maryella Shivers, CRNA Pre-anesthesia Checklist: Patient identified, Emergency Drugs available, Suction available and Patient being monitored Patient Re-evaluated:Patient Re-evaluated prior to induction Oxygen Delivery Method: Circle system utilized Preoxygenation: Pre-oxygenation with 100% oxygen Induction Type: IV induction Ventilation: Mask ventilation without difficulty Laryngoscope Size: Mac and 3 Grade View: Grade I Tube type: Oral Tube size: 7.0 mm Number of attempts: 1 Airway Equipment and Method: Stylet and Oral airway Placement Confirmation: ETT inserted through vocal cords under direct vision, positive ETCO2 and breath sounds checked- equal and bilateral Secured at: 21 cm Tube secured with: Tape Dental Injury: Teeth and Oropharynx as per pre-operative assessment

## 2021-04-25 NOTE — Discharge Instructions (Signed)

## 2021-04-25 NOTE — H&P (Signed)
HPI:   Pamela Bates is a 30 y.o. female who presents as a consult Patient.   Referring Provider: Windy Carina, *  Chief complaint: Strep throat.  HPI: Very healthy young lady, has had recurrent strep throat for most of her life. Tonsillectomy was suggested many years ago. Recently she gets strep about once or twice each year. This year it occurred about a month ago and she ended up in the hospital with some sort of sepsis for about 4 days. She had another strep following that and then was given a shot of an injectable Rocephin yesterday for return of symptoms again without a strep test at this time. She suffers with very mild exercise-induced asthma. She does not smoke. She suffers with some mild seasonal allergies. Otherwise in good health.  PMH/Meds/All/SocHx/FamHx/ROS:   Past Medical History:  Diagnosis Date   Strep throat   Past Surgical History:  Procedure Laterality Date   foreign object removal from ear   OVARIAN CYST REMOVAL   No family history of bleeding disorders, wound healing problems or difficulty with anesthesia.   Social History   Socioeconomic History   Marital status: Single  Spouse name: Not on file   Number of children: Not on file   Years of education: Not on file   Highest education level: Not on file  Occupational History   Not on file  Tobacco Use   Smoking status: Never Smoker   Smokeless tobacco: Never Used  Vaping Use   Vaping Use: Never used  Substance and Sexual Activity   Alcohol use: Not on file   Drug use: Never   Sexual activity: Not on file  Other Topics Concern   Not on file  Social History Narrative   Not on file   Social Determinants of Health   Financial Resource Strain: Not on file  Food Insecurity: Not on file  Transportation Needs: Not on file  Physical Activity: Not on file  Stress: Not on file  Social Connections: Not on file  Housing Stability: Not on file   Current Outpatient Medications:    dextroamphetamine-amphetamine (ADDERALL) 10 mg tablet, TAKE 1 TO 2 TABLETS DAILY AS DIRECTED, Disp: , Rfl:   ferrous sulfate (FERATAB) 325 (65 FE) MG tablet, Take 325 mg by mouth., Disp: , Rfl:   A complete ROS was performed with pertinent positives/negatives noted in the HPI. The remainder of the ROS are negative.   Physical Exam:   BP 121/73  Pulse 108  Temp 97.9 F (36.6 C)  Ht 1.651 m ('5\' 5"'$ )  Wt 59.4 kg (131 lb)  BMI 21.80 kg/m   General: Healthy and alert, in no distress, breathing easily. Normal affect. In a pleasant mood. Head: Normocephalic, atraumatic. No masses, or scars. Eyes: Pupils are equal, and reactive to light. Vision is grossly intact. No spontaneous or gaze nystagmus. Ears: Ear canals are clear. Tympanic membranes are intact, with normal landmarks and the middle ears are clear and healthy. Hearing: Grossly normal. Nose: Nasal cavities reveal bilateral polypoid masses arising from the infundibulum. Airways are patent. Face: No masses or scars, facial nerve function is symmetric. Oral Cavity: No mucosal abnormalities are noted. Tongue with normal mobility. Dentition appears healthy. Oropharynx: Tonsils are symmetric, 1+ in size. There are no mucosal masses identified. Tongue base appears normal and healthy. Larynx/Hypopharynx: deferred Chest: Deferred Neck: No palpable masses, no cervical adenopathy, no thyroid nodules or enlargement. Neuro: Cranial nerves II-XII with normal function. Balance: Normal gate. Other findings: none.  Independent  Review of Additional Tests or Records:  none  Procedures:  none  Impression & Plans:  Recurrent streptococcal tonsillitis. Recommend tonsillectomy.Pamela Bates meets the indications for tonsillectomy. Risks and benefits were discussed in detail. All questions were answered. A handout was provided with additional details. I am going to prescribe Augmentin for her to use if she needs it prior to surgery.  Nasal polyps,  relatively asymptomatic. Recommend CT imaging at her convenience to further evaluate the extent of this disease.

## 2021-04-26 ENCOUNTER — Encounter (HOSPITAL_BASED_OUTPATIENT_CLINIC_OR_DEPARTMENT_OTHER): Payer: Self-pay | Admitting: Otolaryngology

## 2021-04-27 NOTE — Anesthesia Postprocedure Evaluation (Signed)
Anesthesia Post Note  Patient: Pamela Bates  Procedure(s) Performed: TONSILLECTOMY (Mouth)     Patient location during evaluation: PACU Anesthesia Type: General Level of consciousness: awake and alert Pain management: pain level controlled Vital Signs Assessment: post-procedure vital signs reviewed and stable Respiratory status: spontaneous breathing, nonlabored ventilation, respiratory function stable and patient connected to nasal cannula oxygen Cardiovascular status: blood pressure returned to baseline and stable Postop Assessment: no apparent nausea or vomiting Anesthetic complications: no   No notable events documented.  Last Vitals:  Vitals:   04/25/21 1307 04/25/21 1400  BP: 120/78 118/75  Pulse: 76 81  Resp: 14 16  Temp:  37.2 C  SpO2: 100% 99%            Effie Berkshire

## 2021-04-29 DIAGNOSIS — J329 Chronic sinusitis, unspecified: Secondary | ICD-10-CM | POA: Diagnosis not present

## 2021-04-29 DIAGNOSIS — J339 Nasal polyp, unspecified: Secondary | ICD-10-CM | POA: Diagnosis not present

## 2021-04-29 DIAGNOSIS — J0301 Acute recurrent streptococcal tonsillitis: Secondary | ICD-10-CM | POA: Diagnosis not present

## 2021-05-16 DIAGNOSIS — N920 Excessive and frequent menstruation with regular cycle: Secondary | ICD-10-CM | POA: Diagnosis not present

## 2021-05-27 ENCOUNTER — Other Ambulatory Visit: Payer: Self-pay

## 2021-05-27 ENCOUNTER — Encounter (HOSPITAL_COMMUNITY): Payer: Self-pay | Admitting: Emergency Medicine

## 2021-05-27 ENCOUNTER — Emergency Department (HOSPITAL_COMMUNITY)
Admission: EM | Admit: 2021-05-27 | Discharge: 2021-05-28 | Disposition: A | Discharge status: Home / Self Care | Payer: BC Managed Care – PPO | Attending: Physician Assistant | Admitting: Physician Assistant

## 2021-05-27 DIAGNOSIS — Z30430 Encounter for insertion of intrauterine contraceptive device: Secondary | ICD-10-CM | POA: Diagnosis not present

## 2021-05-27 DIAGNOSIS — R102 Pelvic and perineal pain: Secondary | ICD-10-CM | POA: Insufficient documentation

## 2021-05-27 DIAGNOSIS — Z5321 Procedure and treatment not carried out due to patient leaving prior to being seen by health care provider: Secondary | ICD-10-CM | POA: Insufficient documentation

## 2021-05-27 DIAGNOSIS — N938 Other specified abnormal uterine and vaginal bleeding: Secondary | ICD-10-CM | POA: Insufficient documentation

## 2021-05-27 DIAGNOSIS — N939 Abnormal uterine and vaginal bleeding, unspecified: Secondary | ICD-10-CM | POA: Diagnosis not present

## 2021-05-27 LAB — CBC WITH DIFFERENTIAL/PLATELET
Abs Immature Granulocytes: 0.03 10*3/uL (ref 0.00–0.07)
Basophils Absolute: 0.1 10*3/uL (ref 0.0–0.1)
Basophils Relative: 1 %
Eosinophils Absolute: 0.5 10*3/uL (ref 0.0–0.5)
Eosinophils Relative: 4 %
HCT: 31 % — ABNORMAL LOW (ref 36.0–46.0)
Hemoglobin: 9.2 g/dL — ABNORMAL LOW (ref 12.0–15.0)
Immature Granulocytes: 0 %
Lymphocytes Relative: 19 %
Lymphs Abs: 2 10*3/uL (ref 0.7–4.0)
MCH: 23.4 pg — ABNORMAL LOW (ref 26.0–34.0)
MCHC: 29.7 g/dL — ABNORMAL LOW (ref 30.0–36.0)
MCV: 78.9 fL — ABNORMAL LOW (ref 80.0–100.0)
Monocytes Absolute: 0.8 10*3/uL (ref 0.1–1.0)
Monocytes Relative: 7 %
Neutro Abs: 7.3 10*3/uL (ref 1.7–7.7)
Neutrophils Relative %: 69 %
Platelets: 313 10*3/uL (ref 150–400)
RBC: 3.93 MIL/uL (ref 3.87–5.11)
RDW: 16.3 % — ABNORMAL HIGH (ref 11.5–15.5)
WBC: 10.7 10*3/uL — ABNORMAL HIGH (ref 4.0–10.5)
nRBC: 0 % (ref 0.0–0.2)

## 2021-05-27 LAB — BASIC METABOLIC PANEL
Anion gap: 7 (ref 5–15)
BUN: 17 mg/dL (ref 6–20)
CO2: 23 mmol/L (ref 22–32)
Calcium: 9.7 mg/dL (ref 8.9–10.3)
Chloride: 109 mmol/L (ref 98–111)
Creatinine, Ser: 0.69 mg/dL (ref 0.44–1.00)
GFR, Estimated: >60 mL/min (ref >60)
Glucose, Bld: 78 mg/dL (ref 70–99)
Potassium: 4 mmol/L (ref 3.5–5.1)
Sodium: 139 mmol/L (ref 135–145)

## 2021-05-27 NOTE — ED Provider Notes (Signed)
Emergency Medicine Provider Triage Evaluation Note  Pamela Bates , a 30 y.o. female  was evaluated in triage.  Pt complains of severe pelvic pain after 90 placement this morning.  She called the on-call OB/GYN nurse and she was told to come to the ER.  She has had some light but constant bleeding.  She reports pain is spasming and cramping.  Is any nausea or vomiting.  Review of Systems  Positive: As above Negative: As above  Physical Exam  BP 119/73 (BP Location: Left Arm)   Pulse 96   Temp 99.2 F (37.3 C) (Oral)   Resp 16   SpO2 98%  Gen:   Awake, no distress   Resp:  Normal effort  MSK:   Moves extremities without difficulty Other: Mild lower pelvic tenderness, no peritoneal signs, no guarding  Medical Decision Making  Medically screening exam initiated at 7:03 PM.  Appropriate orders placed.  Pamela Bates was informed that the remainder of the evaluation will be completed by another provider, this initial triage assessment does not replace that evaluation, and the importance of remaining in the ED until their evaluation is complete.     Lyndel Safe 05/27/21 Selena Lesser, MD 05/27/21 [phone]

## 2021-05-27 NOTE — ED Triage Notes (Signed)
Pt had IUD placed today is now experiencing pelvic pain, very light bleeding.

## 2021-05-28 NOTE — ED Notes (Signed)
Pt LWBS 

## 2021-05-28 NOTE — ED Notes (Signed)
Patient stated she removed her own IUD IN THE RESTROOM AND SHE LEAVING THE PELVIC PAIN STOP

## 2021-05-30 DIAGNOSIS — T8332XA Displacement of intrauterine contraceptive device, initial encounter: Secondary | ICD-10-CM | POA: Diagnosis not present

## 2021-05-30 DIAGNOSIS — N920 Excessive and frequent menstruation with regular cycle: Secondary | ICD-10-CM | POA: Diagnosis not present

## 2021-07-06 DIAGNOSIS — B009 Herpesviral infection, unspecified: Secondary | ICD-10-CM | POA: Diagnosis not present

## 2021-07-06 DIAGNOSIS — D508 Other iron deficiency anemias: Secondary | ICD-10-CM | POA: Diagnosis not present

## 2021-07-06 DIAGNOSIS — F411 Generalized anxiety disorder: Secondary | ICD-10-CM | POA: Diagnosis not present

## 2021-07-11 DIAGNOSIS — D508 Other iron deficiency anemias: Secondary | ICD-10-CM | POA: Diagnosis not present

## 2021-07-12 DIAGNOSIS — D508 Other iron deficiency anemias: Secondary | ICD-10-CM | POA: Diagnosis not present

## 2021-07-12 DIAGNOSIS — G47419 Narcolepsy without cataplexy: Secondary | ICD-10-CM | POA: Diagnosis not present

## 2021-07-18 DIAGNOSIS — D508 Other iron deficiency anemias: Secondary | ICD-10-CM | POA: Diagnosis not present

## 2021-08-17 DIAGNOSIS — D508 Other iron deficiency anemias: Secondary | ICD-10-CM | POA: Diagnosis not present

## 2021-09-22 DIAGNOSIS — J029 Acute pharyngitis, unspecified: Secondary | ICD-10-CM | POA: Diagnosis not present

## 2021-09-22 DIAGNOSIS — J020 Streptococcal pharyngitis: Secondary | ICD-10-CM | POA: Diagnosis not present

## 2021-09-22 DIAGNOSIS — B3731 Acute candidiasis of vulva and vagina: Secondary | ICD-10-CM | POA: Diagnosis not present

## 2021-10-11 DIAGNOSIS — G47419 Narcolepsy without cataplexy: Secondary | ICD-10-CM | POA: Diagnosis not present

## 2021-10-11 DIAGNOSIS — G4700 Insomnia, unspecified: Secondary | ICD-10-CM | POA: Diagnosis not present

## 2021-10-11 DIAGNOSIS — D508 Other iron deficiency anemias: Secondary | ICD-10-CM | POA: Diagnosis not present

## 2021-10-20 DIAGNOSIS — D508 Other iron deficiency anemias: Secondary | ICD-10-CM | POA: Diagnosis not present

## 2021-10-20 DIAGNOSIS — M5030 Other cervical disc degeneration, unspecified cervical region: Secondary | ICD-10-CM | POA: Diagnosis not present

## 2021-10-20 DIAGNOSIS — M40202 Unspecified kyphosis, cervical region: Secondary | ICD-10-CM | POA: Diagnosis not present

## 2021-10-20 DIAGNOSIS — M4602 Spinal enthesopathy, cervical region: Secondary | ICD-10-CM | POA: Diagnosis not present

## 2021-10-20 DIAGNOSIS — M6248 Contracture of muscle, other site: Secondary | ICD-10-CM | POA: Diagnosis not present

## 2021-10-20 DIAGNOSIS — M40292 Other kyphosis, cervical region: Secondary | ICD-10-CM | POA: Diagnosis not present

## 2021-10-20 DIAGNOSIS — M9901 Segmental and somatic dysfunction of cervical region: Secondary | ICD-10-CM | POA: Diagnosis not present

## 2021-10-25 DIAGNOSIS — M4602 Spinal enthesopathy, cervical region: Secondary | ICD-10-CM | POA: Diagnosis not present

## 2021-10-25 DIAGNOSIS — M9901 Segmental and somatic dysfunction of cervical region: Secondary | ICD-10-CM | POA: Diagnosis not present

## 2021-10-25 DIAGNOSIS — M40292 Other kyphosis, cervical region: Secondary | ICD-10-CM | POA: Diagnosis not present

## 2021-10-25 DIAGNOSIS — M6248 Contracture of muscle, other site: Secondary | ICD-10-CM | POA: Diagnosis not present

## 2021-11-03 DIAGNOSIS — J029 Acute pharyngitis, unspecified: Secondary | ICD-10-CM | POA: Diagnosis not present

## 2021-11-03 DIAGNOSIS — M40292 Other kyphosis, cervical region: Secondary | ICD-10-CM | POA: Diagnosis not present

## 2021-11-03 DIAGNOSIS — M6248 Contracture of muscle, other site: Secondary | ICD-10-CM | POA: Diagnosis not present

## 2021-11-03 DIAGNOSIS — M4602 Spinal enthesopathy, cervical region: Secondary | ICD-10-CM | POA: Diagnosis not present

## 2021-11-03 DIAGNOSIS — J45990 Exercise induced bronchospasm: Secondary | ICD-10-CM | POA: Diagnosis not present

## 2021-11-03 DIAGNOSIS — M9901 Segmental and somatic dysfunction of cervical region: Secondary | ICD-10-CM | POA: Diagnosis not present

## 2021-11-04 DIAGNOSIS — J029 Acute pharyngitis, unspecified: Secondary | ICD-10-CM | POA: Diagnosis not present

## 2021-12-14 DIAGNOSIS — R233 Spontaneous ecchymoses: Secondary | ICD-10-CM | POA: Diagnosis not present

## 2021-12-14 DIAGNOSIS — D508 Other iron deficiency anemias: Secondary | ICD-10-CM | POA: Diagnosis not present

## 2022-01-25 DIAGNOSIS — N898 Other specified noninflammatory disorders of vagina: Secondary | ICD-10-CM | POA: Diagnosis not present

## 2022-03-17 DIAGNOSIS — D508 Other iron deficiency anemias: Secondary | ICD-10-CM | POA: Diagnosis not present

## 2022-04-07 DIAGNOSIS — N921 Excessive and frequent menstruation with irregular cycle: Secondary | ICD-10-CM | POA: Diagnosis not present

## 2022-04-07 DIAGNOSIS — N6489 Other specified disorders of breast: Secondary | ICD-10-CM | POA: Diagnosis not present

## 2022-04-07 DIAGNOSIS — N644 Mastodynia: Secondary | ICD-10-CM | POA: Diagnosis not present

## 2022-04-07 DIAGNOSIS — G47419 Narcolepsy without cataplexy: Secondary | ICD-10-CM | POA: Diagnosis not present

## 2022-04-25 ENCOUNTER — Ambulatory Visit (INDEPENDENT_AMBULATORY_CARE_PROVIDER_SITE_OTHER): Payer: BC Managed Care – PPO | Admitting: Nurse Practitioner

## 2022-04-25 ENCOUNTER — Encounter: Payer: Self-pay | Admitting: Nurse Practitioner

## 2022-04-25 ENCOUNTER — Telehealth: Payer: Self-pay

## 2022-04-25 ENCOUNTER — Other Ambulatory Visit: Payer: Self-pay | Admitting: Nurse Practitioner

## 2022-04-25 VITALS — BP 102/52 | HR 106 | Resp 12 | Ht 65.0 in | Wt 130.0 lb

## 2022-04-25 DIAGNOSIS — N644 Mastodynia: Secondary | ICD-10-CM | POA: Diagnosis not present

## 2022-04-25 DIAGNOSIS — N921 Excessive and frequent menstruation with irregular cycle: Secondary | ICD-10-CM

## 2022-04-25 DIAGNOSIS — N946 Dysmenorrhea, unspecified: Secondary | ICD-10-CM

## 2022-04-25 DIAGNOSIS — Z113 Encounter for screening for infections with a predominantly sexual mode of transmission: Secondary | ICD-10-CM

## 2022-04-25 DIAGNOSIS — N809 Endometriosis, unspecified: Secondary | ICD-10-CM

## 2022-04-25 MED ORDER — TWIRLA 120-30 MCG/24HR TD PTWK: 1.0000 | MEDICATED_PATCH | TRANSDERMAL | 1 refills | Status: DC

## 2022-04-25 MED ORDER — IBUPROFEN 600 MG PO TABS: 600.0000 mg | ORAL_TABLET | Freq: Three times a day (TID) | ORAL | 1 refills | Status: DC | PRN

## 2022-04-25 MED ORDER — MYFEMBREE 40-1-0.5 MG PO TABS: 1.0000 | ORAL_TABLET | Freq: Every day | ORAL | 5 refills | Status: DC

## 2022-04-25 NOTE — Progress Notes (Signed)
Acute Office Visit  Subjective:    Patient ID: Pamela Bates, female    DOB: 1991/04/18, 31 y.o.   MRN: [id number]   HPI 31 y.o. presents today as new patient to discuss irregular, heavy menses. She spots first couple of days, then has heavy bleeding requiring changing of tampons and pads every 3-4 hours, then tapers off. Has severe dysmenorrhea, takes Tylenol as needed with little relief.  Was on COCs, then Nuvaring for 7 years but stopped 3 years ago after being off for 2 weeks and feeling better emotionally without it. IUD inserted at previous GYN office 05/2021 but expelled same day. Had some improvement in bleeding on Lysteda but could not get refills. Normal ultrasound in September 2022. Previous GYN suspected endometriosis with recommendations for surgery for endo evaluation. 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 Dermoid cysts removed as freshman in HS.   H/O iron deficiency anemia and need for transfusions. Last infusion about a year ago. Normal CBC and iron levels 03/17/2022.   She would like breast exam as she was told she has dense breast tissue. Saw PCP with complaints of left breast pain and fullness for 2-3 months. Feels tender into her axillary region. No redness, skin changes, lumps, or nipple discharge. Imaging ordered at that time but she has not heard from imaging center.   Sexually active. Would like STD screening, declines HIV/RPR.   Normal pap history, most recent 05/2021.    Review of Systems  Constitutional: Negative.   Genitourinary:  Positive for menstrual problem and pelvic pain.  Right breast: Negative Left breast: Positive for pain and swelling. Negative for redness, nipple discharge, or skin changes     Objective:    Physical Exam Constitutional:      Appearance: Normal appearance.  Chest:  Breasts:    Right: Normal.     Left: Tenderness present. No swelling, bleeding,  inverted nipple, mass, nipple discharge or skin change.  Genitourinary:    General: Normal vulva.     Vagina: Normal.     Cervix: Normal.     Uterus: Tender. Not enlarged.      BP (!) 102/52 (BP Location: Left Arm, Patient Position: Sitting, Cuff Size: Normal)   Pulse (!) 106   Resp 12   Ht '5\' 5"'$  (1.651 m)   Wt 130 lb (59 kg)   LMP 03/31/2022   BMI 21.63 kg/m  Wt Readings from Last 3 Encounters:  04/25/22 130 lb (59 kg)  05/27/21 122 lb (55.3 kg)  04/25/21 128 lb 1.4 oz (58.1 kg)        Patient informed chaperone available to be present for breast and/or pelvic exam. Patient has requested no chaperone to be present. Patient has been advised what will be completed during breast and pelvic exam.   Assessment & Plan:   Problem List Items Addressed This Visit   None Visit Diagnoses     Menorrhagia with irregular cycle    -  Primary   Breast pain, left       Dysmenorrhea       Relevant Medications   ibuprofen (ADVIL) 600 MG tablet   Screen for STD (sexually transmitted disease)       Relevant Orders   C. trachomatis/N. gonorrhoeae RNA   Endometriosis       Suspected   Relevant Medications   Relugolix-Estradiol-Norethind (MYFEMBREE) 40-1-0.5 MG TABS  Plan: Management of suspected endometriosis discussed including hormonal contraceptive options, Myfembree, Orilissa, laparoscopic surgery. She would like to try Myfembree. She is aware this is not a contraception and is limited to 24 month use due to risk of bone loss. Also recommend Ibuprofen 600-800 mg every 8 hours for cramping and to decrease flow up to 40%. STD panel pending. Will send referral for left breast and axillary ultrasound. She is agreeable to plan.   Return in 2 months for follow up or sooner if needed.      Tamela Gammon DNP, 2:48 PM 04/25/2022

## 2022-04-25 NOTE — Telephone Encounter (Signed)
I placed order and called to schedule. Per Radiologist's protocol a bilateral diagnostic mammo was ordered by [facility] along with the left breast u/s.  Patient is scheduled for 05/03/22 at 8:00am. I spoke with patient and informed her.

## 2022-04-25 NOTE — Telephone Encounter (Signed)
Great. Thank you.

## 2022-04-25 NOTE — Telephone Encounter (Signed)
Left breast ultrasound Received: Today Tamela Gammon, NP  [facility] Triage Please send referral for left breast and axillary ultrasound for pain and enlargement. Thanks.

## 2022-04-26 LAB — C. TRACHOMATIS/N. GONORRHOEAE RNA
C. trachomatis RNA, TMA: NOT DETECTED
N. gonorrhoeae RNA, TMA: NOT DETECTED

## 2022-05-03 ENCOUNTER — Ambulatory Visit
Admission: RE | Admit: 2022-05-03 | Discharge: 2022-05-03 | Disposition: A | Discharge status: Home / Self Care | Payer: BC Managed Care – PPO | Source: Ambulatory Visit | Attending: Nurse Practitioner | Admitting: Nurse Practitioner

## 2022-05-03 ENCOUNTER — Other Ambulatory Visit: Payer: Self-pay | Admitting: Nurse Practitioner

## 2022-05-03 ENCOUNTER — Telehealth: Payer: Self-pay | Admitting: *Deleted

## 2022-05-03 DIAGNOSIS — N6489 Other specified disorders of breast: Secondary | ICD-10-CM

## 2022-05-03 DIAGNOSIS — R922 Inconclusive mammogram: Secondary | ICD-10-CM | POA: Diagnosis not present

## 2022-05-03 DIAGNOSIS — N644 Mastodynia: Secondary | ICD-10-CM

## 2022-05-03 NOTE — Telephone Encounter (Signed)
PA for Twirla 120-30 mcg tablet approved from 05/02/22-05/02/25

## 2022-05-15 ENCOUNTER — Ambulatory Visit
Admission: RE | Admit: 2022-05-15 | Discharge: 2022-05-15 | Disposition: A | Discharge status: Home / Self Care | Payer: BC Managed Care – PPO | Source: Ambulatory Visit | Attending: Nurse Practitioner | Admitting: Nurse Practitioner

## 2022-05-15 DIAGNOSIS — N6489 Other specified disorders of breast: Secondary | ICD-10-CM

## 2022-05-15 DIAGNOSIS — R928 Other abnormal and inconclusive findings on diagnostic imaging of breast: Secondary | ICD-10-CM | POA: Diagnosis not present

## 2022-05-15 DIAGNOSIS — N6011 Diffuse cystic mastopathy of right breast: Secondary | ICD-10-CM | POA: Diagnosis not present

## 2022-05-15 HISTORY — PX: BREAST BIOPSY: SHX20

## 2022-05-29 ENCOUNTER — Ambulatory Visit (HOSPITAL_COMMUNITY)
Admission: EM | Admit: 2022-05-29 | Discharge: 2022-05-29 | Disposition: A | Discharge status: Home / Self Care | Payer: BC Managed Care – PPO | Attending: Internal Medicine | Admitting: Internal Medicine

## 2022-05-29 ENCOUNTER — Encounter (HOSPITAL_COMMUNITY): Payer: Self-pay | Admitting: Emergency Medicine

## 2022-05-29 DIAGNOSIS — J020 Streptococcal pharyngitis: Secondary | ICD-10-CM | POA: Diagnosis not present

## 2022-05-29 DIAGNOSIS — Z1152 Encounter for screening for COVID-19: Secondary | ICD-10-CM | POA: Insufficient documentation

## 2022-05-29 LAB — POCT RAPID STREP A, ED / UC: Streptococcus, Group A Screen (Direct): POSITIVE — AB

## 2022-05-29 MED ORDER — AMOXICILLIN 500 MG PO CAPS: 500.0000 mg | ORAL_CAPSULE | Freq: Two times a day (BID) | ORAL | 0 refills | Status: AC

## 2022-05-29 MED ORDER — IBUPROFEN 800 MG PO TABS
ORAL_TABLET | ORAL | Status: AC
  Filled 2022-05-29: qty 1

## 2022-05-29 MED ORDER — IBUPROFEN 800 MG PO TABS
800.0000 mg | ORAL_TABLET | Freq: Once | ORAL | Status: AC
  Administered 2022-05-29: 800 mg via ORAL

## 2022-05-29 MED ORDER — GUAIFENESIN ER 600 MG PO TB12: 600.0000 mg | ORAL_TABLET | Freq: Two times a day (BID) | ORAL | 0 refills | Status: DC

## 2022-05-29 MED ORDER — IBUPROFEN 800 MG PO TABS: 800.0000 mg | ORAL_TABLET | Freq: Three times a day (TID) | ORAL | 0 refills | Status: DC

## 2022-05-29 MED ORDER — AMOXICILLIN 500 MG PO CAPS: 500.0000 mg | ORAL_CAPSULE | Freq: Three times a day (TID) | ORAL | 0 refills | Status: DC

## 2022-05-29 MED ORDER — FLUCONAZOLE 150 MG PO TABS: 150.0000 mg | ORAL_TABLET | ORAL | 0 refills | Status: DC

## 2022-05-29 NOTE — Discharge Instructions (Signed)
Your strep test was positive in clinic today. You may take ibuprofen every 8 hours as needed for pain to the throat.  Take this medicine with food to avoid stomach upset.  We gave you dose in the clinic so your next dose may be in 8 hours.  Take amoxicillin twice daily for the next 10 days to treat streptococcal pharyngitis.  Call the ear nose and throat doctor listed on your paperwork for follow-up appointment due to frequent streptococcal pharyngitis infections.  If you develop any new or worsening symptoms or do not improve in the next 2 to 3 days, please return.  If your symptoms are severe, please go to the emergency room.  Follow-up with your primary care provider for further evaluation and management of your symptoms as well as ongoing wellness visits.  I hope you feel better!

## 2022-05-29 NOTE — ED Triage Notes (Signed)
Pt reports a sore throat, nasal congestion and headache, swollen lymph nodes x 1.5 weeks. States she had tonsils removed about a year ago.

## 2022-05-29 NOTE — ED Provider Notes (Signed)
[place]    CSN: [id number] Arrival date & time: 05/29/22  1535      History   Chief Complaint Chief Complaint  Patient presents with   Nasal Congestion   Headache   Sore Throat    HPI Pamela Bates is a 31 y.o. female.   Patient presents to urgent care for evaluation of sore throat, nasal congestion, headache, and fever/chills that started on Thursday, May 25, 2022 (4 days ago).  Fever/chills have since resolved and she is now experiencing a generalized headache that is a 3 on a scale of 0-10 and throat pain when swallowing that is an 8 on a scale of 0-10.  Nasal congestion is thick and clear but has been intermittently yellow as well.  Denies cough, shortness of breath, chest pain, nausea, vomiting, abdominal pain, neck pain, ear pain, blurry vision, decreased visual acuity, dizziness, and body aches.  Patient has experienced frequent streptococcal pharyngitis infections in the past to the point where she became septic last year and had to have her tonsils removed due to frequent strep throat infections that became severe.  She has continued to have frequent strep infections.  She has not been evaluated by her ear nose and throat doctor in approximately 1 year.  She has not attempted use of any over-the-counter medications other than Tylenol prior to arrival urgent care for symptoms.   Headache Sore Throat Associated symptoms include headaches.    Past Medical History:  Diagnosis Date   Anemia    Endometrial hyperplasia, unspecified    Narcolepsy     Patient Active Problem List   Diagnosis Date Noted   S/P tonsillectomy 04/25/2021   Hypokalemia 03/20/2021   Sepsis ([place]) 03/20/2021   Microcytic anemia    Sepsis due to group A Streptococcus ([place]) 03/19/2021    Past Surgical History:  Procedure Laterality Date   BREAST BIOPSY Right 05/15/2022   OVARIAN CYST REMOVAL     TONSILLECTOMY N/A 04/25/2021   Procedure: TONSILLECTOMY;  Surgeon: Izora Gala, MD;  Location: [place];  Service: ENT;  Laterality: N/A;    OB History     Gravida  0   Para  0   Term  0   Preterm  0   AB  0   Living  0      SAB  0   IAB  0   Ectopic  0   Multiple  0   Live Births  0            Home Medications    Prior to Admission medications   Medication Sig Start Date End Date Taking? Authorizing Provider  fluconazole (DIFLUCAN) 150 MG tablet Take 1 tablet (150 mg total) by mouth every 7 (seven) days. 05/29/22  Yes Talbot Grumbling, FNP  guaiFENesin (MUCINEX) 600 MG 12 hr tablet Take 1 tablet (600 mg total) by mouth 2 (two) times daily. 05/29/22  Yes Talbot Grumbling, FNP  ibuprofen (ADVIL) 800 MG tablet Take 1 tablet (800 mg total) by mouth 3 (three) times daily. 05/29/22  Yes Talbot Grumbling, FNP  amoxicillin (AMOXIL) 500 MG capsule Take 1 capsule (500 mg total) by mouth 2 (two) times daily for 10 days. 05/29/22 06/08/22  Talbot Grumbling, FNP  amphetamine-dextroamphetamine (ADDERALL) 10 MG tablet Take 10-20 mg by mouth daily. 12/27/20   [provider]  cetirizine (ZYRTEC) 10 MG tablet Take 10 mg by mouth daily as needed for allergies.  [provider]  ferrous sulfate 325 (65 FE) MG tablet Take 1 tablet (325 mg total) by mouth daily. 03/22/21 03/22/22  Charlynne Cousins, MD  Levonorgestrel-Eth Estradiol Molli Posey) 120-30 MCG/24HR PTWK Place 1 patch onto the skin once a week. 04/25/22   Tamela Gammon, NP    Family History History reviewed. No pertinent family history.  Social History Social History   Tobacco Use   Smoking status: Never    Passive exposure: Never   Smokeless tobacco: Never  Substance Use Topics   Alcohol use: Yes    Comment: 2-3 x a week   Drug use: No    Comment: first intercourse- 60 y.o., <10 partners     Allergies   Patient has no known allergies.   Review of Systems Review of Systems  Neurological:  Positive for headaches.  Per  HPI   Physical Exam Triage Vital Signs ED Triage Vitals  Enc Vitals Group     BP 05/29/22 1628 115/72     Pulse Rate 05/29/22 1628 85     Resp 05/29/22 1628 18     Temp 05/29/22 1628 98.3 F (36.8 C)     Temp Source 05/29/22 1628 Oral     SpO2 05/29/22 1628 97 %     Weight --      Height --      Head Circumference --      Peak Flow --      Pain Score 05/29/22 1626 8     Pain Loc --      Pain Edu? --      Excl. in [place]? --    No data found.  Updated Vital Signs BP 115/72 (BP Location: Left Arm)   Pulse 85   Temp 98.3 F (36.8 C) (Oral)   Resp 18   SpO2 97%   Visual Acuity Right Eye Distance:   Left Eye Distance:   Bilateral Distance:    Right Eye Near:   Left Eye Near:    Bilateral Near:     Physical Exam Vitals and nursing note reviewed.  Constitutional:      Appearance: Normal appearance. She is not ill-appearing or toxic-appearing.  HENT:     Head: Normocephalic and atraumatic.     Right Ear: Hearing, tympanic membrane, ear canal and external ear normal.     Left Ear: Hearing, tympanic membrane, ear canal and external ear normal.     Nose: Congestion present.     Mouth/Throat:     Lips: Pink.     Mouth: Mucous membranes are moist.     Pharynx: Posterior oropharyngeal erythema present.  Eyes:     General: Lids are normal. Vision grossly intact. Gaze aligned appropriately.        Right eye: No discharge.        Left eye: No discharge.     Extraocular Movements: Extraocular movements intact.     Conjunctiva/sclera: Conjunctivae normal.     Pupils: Pupils are equal, round, and reactive to light.  Cardiovascular:     Rate and Rhythm: Normal rate and regular rhythm.     Heart sounds: Normal heart sounds, S1 normal and S2 normal.  Pulmonary:     Effort: Pulmonary effort is normal. No respiratory distress.     Breath sounds: Normal breath sounds and air entry.  Musculoskeletal:     Cervical back: Neck supple.  Lymphadenopathy:     Cervical: Cervical  adenopathy present.  Skin:    General: Skin is  warm and dry.     Capillary Refill: Capillary refill takes less than 2 seconds.     Findings: No rash.  Neurological:     General: No focal deficit present.     Mental Status: She is alert and oriented to person, place, and time. Mental status is at baseline.     Cranial Nerves: No dysarthria or facial asymmetry.     Motor: No weakness.     Gait: Gait normal.  Psychiatric:        Mood and Affect: Mood normal.        Speech: Speech normal.        Behavior: Behavior normal.        Thought Content: Thought content normal.        Judgment: Judgment normal.      UC Treatments / Results  Labs (all labs ordered are listed, but only abnormal results are displayed) Labs Reviewed  POCT RAPID STREP A, ED / UC - Abnormal; Notable for the following components:      Result Value   Streptococcus, Group A Screen (Direct) POSITIVE (*)    All other components within normal limits  SARS CORONAVIRUS 2 (TAT 6-24 HRS)    EKG   Radiology No results found.  Procedures Procedures (including critical care time)  Medications Ordered in UC Medications  ibuprofen (ADVIL) tablet 800 mg (800 mg Oral Given 05/29/22 1731)    Initial Impression / Assessment and Plan / UC Course  I have reviewed the triage vital signs and the nursing notes.  Pertinent labs & imaging results that were available during my care of the patient were reviewed by me and considered in my medical decision making (see chart for details).   1.  Streptococcal pharyngitis Group A strep testing in the clinic is positive.  Amoxicillin twice daily for the next 10 days sent to pharmacy.  Patient states that she gets frequent vaginal yeast infections after using antibiotics and is requesting Diflucan to be sent to the pharmacy as well.  Patient may take Diflucan once today at the start of antibiotic course and again in 7 days to treat empirically for vaginal yeast infection related to  antibiotic use.  Patient given ibuprofen 800 mg in the clinic today for throat pain and may take this every 8 hours at home with food.  Guaifenesin sent to the pharmacy to thin nasal mucus so that she is able to blow out of her nose easier and help with nasal congestion.  Patient sees Dr. Constance Holster for ear nose and throat and has been advised to schedule another appointment with him for follow-up as this is her fourth streptococcal infection in the last year.  Patient agreeable with plan.  Discussed physical exam and available lab work findings in clinic with patient.  Counseled patient regarding appropriate use of medications and potential side effects for all medications recommended or prescribed today. Discussed red flag signs and symptoms of worsening condition,when to call the PCP office, return to urgent care, and when to seek higher level of care in the emergency department. Patient verbalizes understanding and agreement with plan. All questions answered. Patient discharged in stable condition.      Final Clinical Impressions(s) / UC Diagnoses   Final diagnoses:  Streptococcal pharyngitis  Streptococcal sore throat     Discharge Instructions      Your strep test was positive in clinic today. You may take ibuprofen every 8 hours as needed for pain to the throat.  Take this medicine with food to avoid stomach upset.  We gave you dose in the clinic so your next dose may be in 8 hours.  Take amoxicillin twice daily for the next 10 days to treat streptococcal pharyngitis.  Call the ear nose and throat doctor listed on your paperwork for follow-up appointment due to frequent streptococcal pharyngitis infections.  If you develop any new or worsening symptoms or do not improve in the next 2 to 3 days, please return.  If your symptoms are severe, please go to the emergency room.  Follow-up with your primary care provider for further evaluation and management of your symptoms as well as ongoing  wellness visits.  I hope you feel better!     ED Prescriptions     Medication Sig Dispense Auth. Provider   amoxicillin (AMOXIL) 500 MG capsule  (Status: Discontinued) Take 1 capsule (500 mg total) by mouth 3 (three) times daily for 10 days. 30 capsule Joella Prince M, FNP   ibuprofen (ADVIL) 800 MG tablet Take 1 tablet (800 mg total) by mouth 3 (three) times daily. 21 tablet Joella Prince M, FNP   amoxicillin (AMOXIL) 500 MG capsule Take 1 capsule (500 mg total) by mouth 2 (two) times daily for 10 days. 20 capsule Joella Prince M, FNP   fluconazole (DIFLUCAN) 150 MG tablet Take 1 tablet (150 mg total) by mouth every 7 (seven) days. 2 tablet Talbot Grumbling, FNP   guaiFENesin (MUCINEX) 600 MG 12 hr tablet Take 1 tablet (600 mg total) by mouth 2 (two) times daily. 30 tablet Talbot Grumbling, FNP      PDMP not reviewed this encounter.   Talbot Grumbling, [place] 05/29/22 [phone]

## 2022-05-30 ENCOUNTER — Other Ambulatory Visit: Payer: Self-pay

## 2022-05-30 DIAGNOSIS — N946 Dysmenorrhea, unspecified: Secondary | ICD-10-CM

## 2022-05-30 DIAGNOSIS — N921 Excessive and frequent menstruation with irregular cycle: Secondary | ICD-10-CM

## 2022-05-30 LAB — SARS CORONAVIRUS 2 (TAT 6-24 HRS): SARS Coronavirus 2: NEGATIVE

## 2022-05-30 MED ORDER — TWIRLA 120-30 MCG/24HR TD PTWK: 1.0000 | MEDICATED_PATCH | TRANSDERMAL | 1 refills | Status: DC

## 2022-05-30 NOTE — Telephone Encounter (Signed)
Medication refill request: Pamela Bates 120-30 Last OV:  04-25-22 Last MMG (if hormonal medication request): 05-03-22 bilateral & breast biopsy 05-15-22 Refill authorized: rx was sent to [facility] at appt. Refill request just came from [employer] order requesting refill. Please approve if appropriate

## 2022-06-16 ENCOUNTER — Encounter: Payer: Self-pay | Admitting: Nurse Practitioner

## 2022-06-19 DIAGNOSIS — N6001 Solitary cyst of right breast: Secondary | ICD-10-CM | POA: Diagnosis not present

## 2022-06-19 DIAGNOSIS — Z9089 Acquired absence of other organs: Secondary | ICD-10-CM | POA: Diagnosis not present

## 2022-06-19 DIAGNOSIS — J020 Streptococcal pharyngitis: Secondary | ICD-10-CM | POA: Diagnosis not present

## 2022-07-13 ENCOUNTER — Encounter (HOSPITAL_COMMUNITY): Payer: Self-pay

## 2022-07-13 ENCOUNTER — Ambulatory Visit (HOSPITAL_COMMUNITY)
Admission: EM | Admit: 2022-07-13 | Discharge: 2022-07-13 | Disposition: A | Discharge status: Home / Self Care | Payer: BC Managed Care – PPO | Attending: Internal Medicine | Admitting: Internal Medicine

## 2022-07-13 DIAGNOSIS — N390 Urinary tract infection, site not specified: Secondary | ICD-10-CM | POA: Diagnosis not present

## 2022-07-13 DIAGNOSIS — R319 Hematuria, unspecified: Secondary | ICD-10-CM | POA: Diagnosis not present

## 2022-07-13 LAB — POCT URINALYSIS DIPSTICK, ED / UC
Bilirubin Urine: NEGATIVE
Glucose, UA: NEGATIVE mg/dL
Ketones, ur: NEGATIVE mg/dL
Nitrite: NEGATIVE
Protein, ur: NEGATIVE mg/dL
Specific Gravity, Urine: 1.015 (ref 1.005–1.030)
Urobilinogen, UA: 0.2 mg/dL (ref 0.0–1.0)
pH: 6 (ref 5.0–8.0)

## 2022-07-13 MED ORDER — FLUCONAZOLE 150 MG PO TABS: ORAL_TABLET | ORAL | 0 refills | Status: DC

## 2022-07-13 MED ORDER — SULFAMETHOXAZOLE-TRIMETHOPRIM 800-160 MG PO TABS: 1.0000 | ORAL_TABLET | Freq: Two times a day (BID) | ORAL | 0 refills | Status: AC

## 2022-07-13 NOTE — ED Provider Notes (Signed)
[place]    CSN: [id number] Arrival date & time: 07/13/22  1708      History   Chief Complaint Chief Complaint  Patient presents with   Dysuria    HPI Pamela Bates is a 31 y.o. female.  Patient complaining of urinary urgency that started yesterday.  Patient reports hematuria that she noticed today.  Patient reports suprapubic pressure. Patient denies any recent UTI.  Patient denies any vaginal discharge.  Patient denies any exposure to an STD.  Patient denies any burning with urination.  Patient has not taken any medications for symptoms.  Last menstrual period was on June 20, 2022.    Dysuria Associated symptoms: no abdominal pain, no fever, no flank pain, no nausea, no vaginal discharge and no vomiting     Past Medical History:  Diagnosis Date   Anemia    Endometrial hyperplasia, unspecified    Narcolepsy     Patient Active Problem List   Diagnosis Date Noted   S/P tonsillectomy 04/25/2021   Hypokalemia 03/20/2021   Sepsis ([place]) 03/20/2021   Microcytic anemia    Sepsis due to group A Streptococcus ([place]) 03/19/2021    Past Surgical History:  Procedure Laterality Date   BREAST BIOPSY Right 05/15/2022   OVARIAN CYST REMOVAL     TONSILLECTOMY N/A 04/25/2021   Procedure: TONSILLECTOMY;  Surgeon: Izora Gala, MD;  Location: [facility];  Service: ENT;  Laterality: N/A;    OB History     Gravida  0   Para  0   Term  0   Preterm  0   AB  0   Living  0      SAB  0   IAB  0   Ectopic  0   Multiple  0   Live Births  0            Home Medications    Prior to Admission medications   Medication Sig Start Date End Date Taking? Authorizing Provider  fluconazole (DIFLUCAN) 150 MG tablet Take 1 tablet when starting antibiotic regiment and 1 tablet at the end of antibiotic regiment 07/13/22  Yes Flossie Dibble, NP  sulfamethoxazole-trimethoprim (BACTRIM DS) 800-160 MG tablet Take 1 tablet by mouth 2 (two) times  daily for 7 days. 07/13/22 07/20/22 Yes Malachai Schalk, Fidela Juneau, NP  amphetamine-dextroamphetamine (ADDERALL) 10 MG tablet Take 10-20 mg by mouth daily. 12/27/20   [provider]  cetirizine (ZYRTEC) 10 MG tablet Take 10 mg by mouth daily as needed for allergies.    [provider]  ferrous sulfate 325 (65 FE) MG tablet Take 1 tablet (325 mg total) by mouth daily. 03/22/21 03/22/22  Charlynne Cousins, MD  guaiFENesin (MUCINEX) 600 MG 12 hr tablet Take 1 tablet (600 mg total) by mouth 2 (two) times daily. 05/29/22   Talbot Grumbling, FNP  ibuprofen (ADVIL) 800 MG tablet Take 1 tablet (800 mg total) by mouth 3 (three) times daily. 05/29/22   Talbot Grumbling, FNP  Levonorgestrel-Eth Estradiol Molli Posey) 120-30 MCG/24HR PTWK Place 1 patch onto the skin once a week. 05/30/22   Tamela Gammon, NP    Family History History reviewed. No pertinent family history.  Social History Social History   Tobacco Use   Smoking status: Never    Passive exposure: Never   Smokeless tobacco: Never  Substance Use Topics   Alcohol use: Yes    Comment: 2-3 x a week   Drug use: No  Comment: first intercourse- 65 y.o., <10 partners     Allergies   Patient has no known allergies.   Review of Systems Review of Systems  Constitutional:  Negative for activity change, appetite change, chills, fatigue and fever.  Gastrointestinal:  Negative for abdominal distention, abdominal pain, diarrhea, nausea and vomiting.  Genitourinary:  Positive for hematuria and urgency. Negative for decreased urine volume, difficulty urinating, dyspareunia, dysuria, enuresis, flank pain, frequency, genital sores, menstrual problem, pelvic pain, vaginal bleeding, vaginal discharge and vaginal pain.     Physical Exam Triage Vital Signs ED Triage Vitals [07/13/22 1754]  Enc Vitals Group     BP 117/75     Pulse Rate 82     Resp 12     Temp 98 F (36.7 C)     Temp Source Oral     SpO2 98 %     Weight       Height      Head Circumference      Peak Flow      Pain Score 0     Pain Loc      Pain Edu?      Excl. in [place]?    No data found.  Updated Vital Signs BP 117/75 (BP Location: Right Arm)   Pulse 82   Temp 98 F (36.7 C) (Oral)   Resp 12   LMP 06/20/2022   SpO2 98%   Physical Exam Vitals and nursing note reviewed.  Constitutional:      Appearance: Normal appearance.  Abdominal:     General: Abdomen is flat. There is no distension. There are no signs of injury.     Palpations: There is no shifting dullness, fluid wave, hepatomegaly, splenomegaly, mass or pulsatile mass.     Tenderness: There is abdominal tenderness in the suprapubic area. There is right CVA tenderness and left CVA tenderness. There is no guarding or rebound.  Neurological:     Mental Status: She is alert.      UC Treatments / Results  Labs (all labs ordered are listed, but only abnormal results are displayed) Labs Reviewed  POCT URINALYSIS DIPSTICK, ED / UC - Abnormal; Notable for the following components:      Result Value   Hgb urine dipstick LARGE (*)    Leukocytes,Ua MODERATE (*)    All other components within normal limits  URINE CULTURE    EKG   Radiology No results found.  Procedures Procedures (including critical care time)  Medications Ordered in UC Medications - No data to display  Initial Impression / Assessment and Plan / UC Course  I have reviewed the triage vital signs and the nursing notes.  Pertinent labs & imaging results that were available during my care of the patient were reviewed by me and considered in my medical decision making (see chart for details).     Patient was evaluated for urinary tract infection.  Urinalysis showed large blood and moderate leukocytes .  Due to symptomology and assessment , antimicrobial choice provides coverage for any beginning stages of kidney involvement . Bactrim prescription was ordered, patient made aware of medication regiment  and possible side effects. Urine Culture pending to verify appropriate anti-microbial coverage.  Diflucan sent to the pharmacy per patient's request due to yeast infection side effects from antimicrobial medication.  Patient was made aware of the timeline for symptom resolution and when follow-up would be necessary.  Patient verbalized understanding of instructions.  Final Clinical Impressions(s) / UC Diagnoses  Final diagnoses:  Urinary tract infection with hematuria, site unspecified     Discharge Instructions      Bactrim has been sent to the pharmacy, you will take this medication 2 times a day for the next 7 days.   Urine culture is pending, we will call you if the test results warrant a change in the plan of care.   Please stay hydrated drinking at least 8 cups of water daily.   If symptoms do not improve after medication regiment you may present for follow-up at this office.      ED Prescriptions     Medication Sig Dispense Auth. Provider   sulfamethoxazole-trimethoprim (BACTRIM DS) 800-160 MG tablet Take 1 tablet by mouth 2 (two) times daily for 7 days. 14 tablet Flossie Dibble, NP   fluconazole (DIFLUCAN) 150 MG tablet Take 1 tablet when starting antibiotic regiment and 1 tablet at the end of antibiotic regiment 2 tablet Flossie Dibble, NP      PDMP not reviewed this encounter.   Flossie Dibble, NP 07/13/22 1900

## 2022-07-13 NOTE — ED Triage Notes (Signed)
Pt is here for possible uti x 2days

## 2022-07-13 NOTE — Discharge Instructions (Addendum)
Bactrim has been sent to the pharmacy, you will take this medication 2 times a day for the next 7 days.   Urine culture is pending, we will call you if the test results warrant a change in the plan of care.   Please stay hydrated drinking at least 8 cups of water daily.   If symptoms do not improve after medication regiment you may present for follow-up at this office.

## 2022-07-15 LAB — URINE CULTURE: Special Requests: NORMAL

## 2022-07-18 ENCOUNTER — Ambulatory Visit (HOSPITAL_COMMUNITY)
Admission: RE | Admit: 2022-07-18 | Discharge: 2022-07-18 | Disposition: A | Discharge status: Home / Self Care | Payer: BC Managed Care – PPO | Source: Ambulatory Visit | Attending: Family Medicine | Admitting: Family Medicine

## 2022-07-18 ENCOUNTER — Encounter (HOSPITAL_COMMUNITY): Payer: Self-pay

## 2022-07-18 VITALS — BP 117/69 | HR 75 | Temp 98.0°F | Resp 16

## 2022-07-18 DIAGNOSIS — R319 Hematuria, unspecified: Secondary | ICD-10-CM

## 2022-07-18 DIAGNOSIS — N3091 Cystitis, unspecified with hematuria: Secondary | ICD-10-CM

## 2022-07-18 DIAGNOSIS — N3090 Cystitis, unspecified without hematuria: Secondary | ICD-10-CM

## 2022-07-18 LAB — POCT URINALYSIS DIPSTICK, ED / UC
Bilirubin Urine: NEGATIVE
Bilirubin Urine: NEGATIVE
Glucose, UA: NEGATIVE mg/dL
Glucose, UA: NEGATIVE mg/dL
Ketones, ur: NEGATIVE mg/dL
Ketones, ur: NEGATIVE mg/dL
Leukocytes,Ua: NEGATIVE
Leukocytes,Ua: NEGATIVE
Nitrite: NEGATIVE
Nitrite: NEGATIVE
Protein, ur: NEGATIVE mg/dL
Protein, ur: NEGATIVE mg/dL
Specific Gravity, Urine: 1.025 (ref 1.005–1.030)
Specific Gravity, Urine: 1.025 (ref 1.005–1.030)
Urobilinogen, UA: 0.2 mg/dL (ref 0.0–1.0)
Urobilinogen, UA: 0.2 mg/dL (ref 0.0–1.0)
pH: 6.5 (ref 5.0–8.0)
pH: 6.5 (ref 5.0–8.0)

## 2022-07-18 NOTE — ED Triage Notes (Signed)
Pt reports she was seen here last week with c/o hematuria. States she received a call stating her labs were abnormal and that she needed to come back.  Reports she has not seen any blood in her urine since the last visit.

## 2022-07-19 NOTE — ED Provider Notes (Signed)
  [place]    ASSESSMENT & PLAN:  1. Cystitis   2. Hematuria, unspecified type     Feeling better. U/A with trace Hg only; better than previous. No further treatment warranted. May f/u as needed.  Outlined signs and symptoms indicating need for more acute intervention. Patient verbalized understanding. After Visit Summary given.  SUBJECTIVE:  Pamela Bates is a 31 y.o. female who is here for f/u. Recent tx for UTI. Feeling better; completed antibiotics. No urinary symptoms.  LMP: Patient's last menstrual period was 06/20/2022.  OBJECTIVE:  Vitals:   07/18/22 1822  BP: 117/69  Pulse: 75  Resp: 16  Temp: 98 F (36.7 C)  TempSrc: Oral  SpO2: 100%   General appearance: alert; no distress Psychological: alert and cooperative; normal mood and affect  Labs Reviewed  POCT URINALYSIS DIPSTICK, ED / UC - Abnormal; Notable for the following components:      Result Value   Hgb urine dipstick TRACE (*)    All other components within normal limits  POCT URINALYSIS DIPSTICK, ED / UC - Abnormal; Notable for the following components:   Hgb urine dipstick TRACE (*)    All other components within normal limits    No Known Allergies  Past Medical History:  Diagnosis Date   Anemia    Endometrial hyperplasia, unspecified    Narcolepsy    Social History   Socioeconomic History   Marital status: Single    Spouse name: Not on file   Number of children: Not on file   Years of education: Not on file   Highest education level: Not on file  Occupational History   Not on file  Tobacco Use   Smoking status: Never    Passive exposure: Never   Smokeless tobacco: Never  Vaping Use   Vaping Use: Not on file  Substance and Sexual Activity   Alcohol use: Yes    Comment: 2-3 x a week   Drug use: No    Comment: first intercourse- 44 y.o., <10 partners   Sexual activity: Yes    Birth control/protection: None  Other Topics Concern   Not on file  Social History  Narrative   Not on file   Social Determinants of Health   Financial Resource Strain: Not on file  Food Insecurity: Not on file  Transportation Needs: Not on file  Physical Activity: Not on file  Stress: Not on file  Social Connections: Not on file  Intimate Partner Violence: Not on file   History reviewed. No pertinent family history.      Vanessa Kick, MD 07/19/22 [phone]

## 2022-11-27 DIAGNOSIS — Z30017 Encounter for initial prescription of implantable subdermal contraceptive: Secondary | ICD-10-CM | POA: Diagnosis not present

## 2022-11-28 ENCOUNTER — Other Ambulatory Visit: Payer: Self-pay | Admitting: Nurse Practitioner

## 2022-11-28 DIAGNOSIS — Z9889 Other specified postprocedural states: Secondary | ICD-10-CM

## 2022-12-11 ENCOUNTER — Ambulatory Visit: Payer: BC Managed Care – PPO

## 2022-12-11 ENCOUNTER — Other Ambulatory Visit: Payer: Self-pay | Admitting: Nurse Practitioner

## 2022-12-11 ENCOUNTER — Ambulatory Visit
Admission: RE | Admit: 2022-12-11 | Discharge: 2022-12-11 | Disposition: A | Discharge status: Home / Self Care | Payer: BC Managed Care – PPO | Source: Ambulatory Visit | Attending: Nurse Practitioner | Admitting: Nurse Practitioner

## 2022-12-11 DIAGNOSIS — Z3046 Encounter for surveillance of implantable subdermal contraceptive: Secondary | ICD-10-CM | POA: Diagnosis not present

## 2022-12-11 DIAGNOSIS — F419 Anxiety disorder, unspecified: Secondary | ICD-10-CM | POA: Diagnosis not present

## 2022-12-11 DIAGNOSIS — Z9889 Other specified postprocedural states: Secondary | ICD-10-CM

## 2022-12-11 DIAGNOSIS — R922 Inconclusive mammogram: Secondary | ICD-10-CM | POA: Diagnosis not present

## 2022-12-11 DIAGNOSIS — R000 Tachycardia, unspecified: Secondary | ICD-10-CM | POA: Diagnosis not present

## 2022-12-25 DIAGNOSIS — Z3046 Encounter for surveillance of implantable subdermal contraceptive: Secondary | ICD-10-CM | POA: Diagnosis not present

## 2023-01-18 DIAGNOSIS — J020 Streptococcal pharyngitis: Secondary | ICD-10-CM | POA: Diagnosis not present

## 2023-01-18 DIAGNOSIS — J339 Nasal polyp, unspecified: Secondary | ICD-10-CM | POA: Diagnosis not present

## 2023-01-22 IMAGING — CT CT ABD-PELV W/ CM
2 of 4 series · 15 of 46 positions shown, 17 images · IV contrast (APPLIED)
Comparison: None.

CLINICAL DATA: Left lower quadrant abdominal pain, left flank pain

EXAM:
CT ABDOMEN AND PELVIS WITH CONTRAST
TECHNIQUE: Multidetector CT imaging of the abdomen and pelvis was performed
using the standard protocol following bolus administration of
intravenous contrast.
CONTRAST:  100mL OMNIPAQUE IOHEXOL 300 MG/ML  SOLN

[Series 2: abd pel w · axial · 0.63mm/px · z∈[+798,+1213]mm · 12 of 91 slices shown, 14 images]
[im 4/91  soft-tissue]
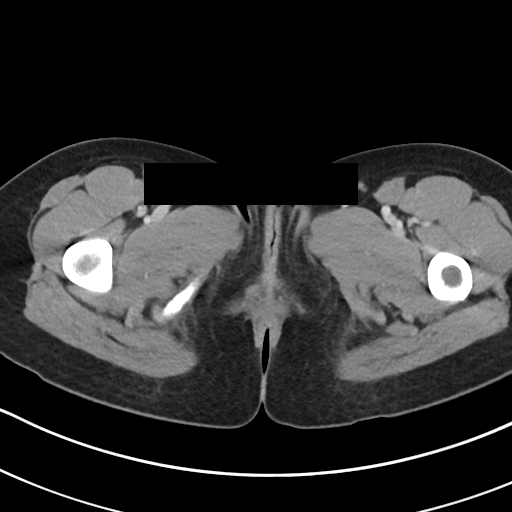
[im 4/91  bone]
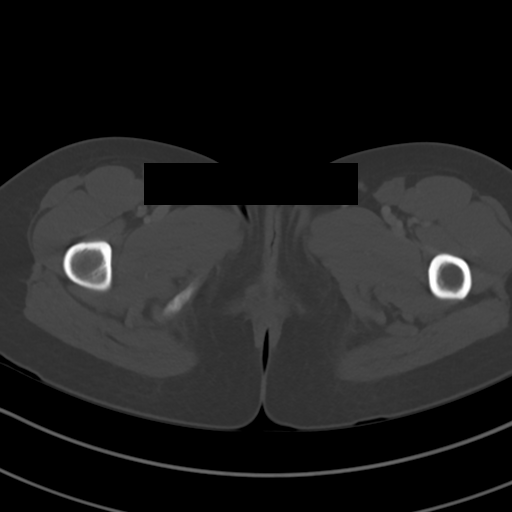
[im 12/91  soft-tissue]
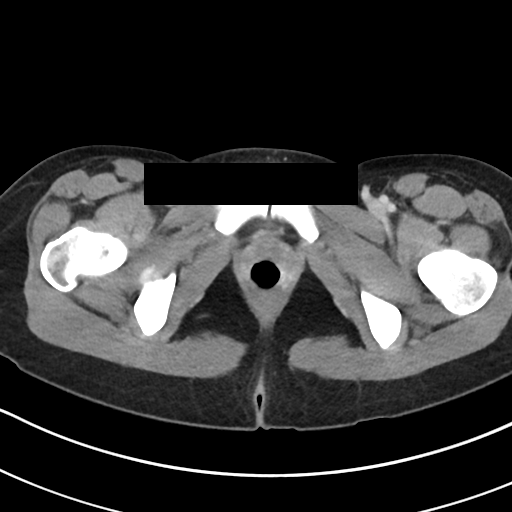
[im 19/91  soft-tissue]
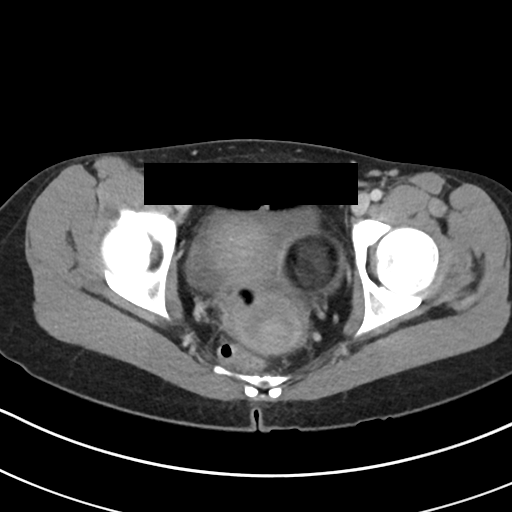
[im 27/91  soft-tissue]
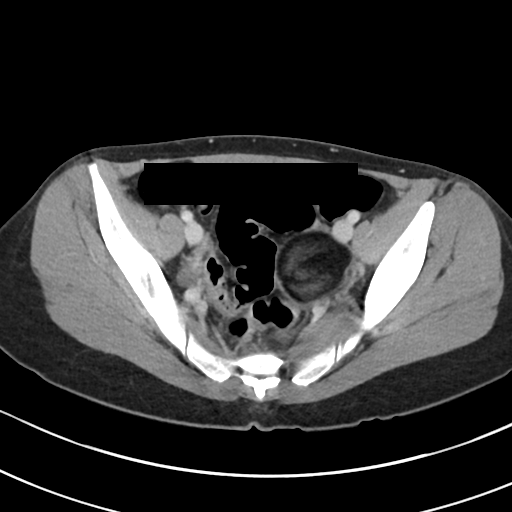
[im 34/91  soft-tissue]
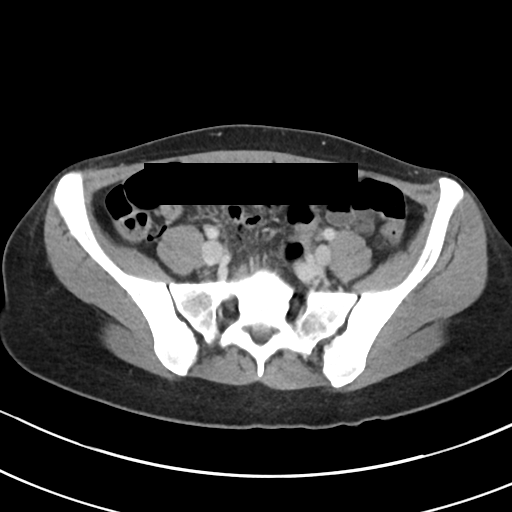
[im 42/91  soft-tissue]
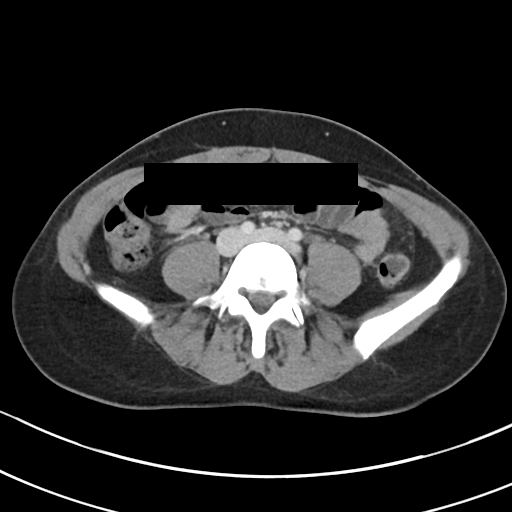
[im 49/91  soft-tissue]
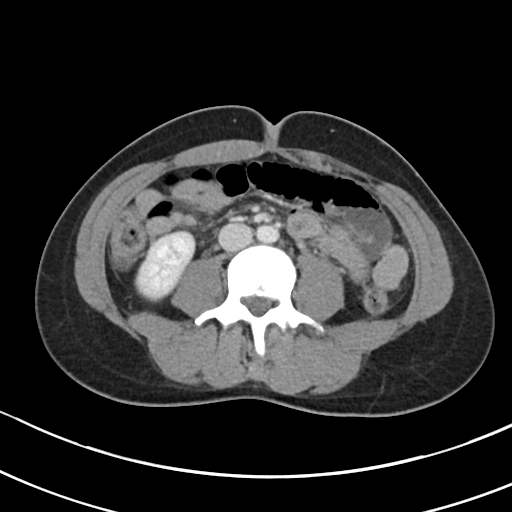
[im 57/91  soft-tissue]
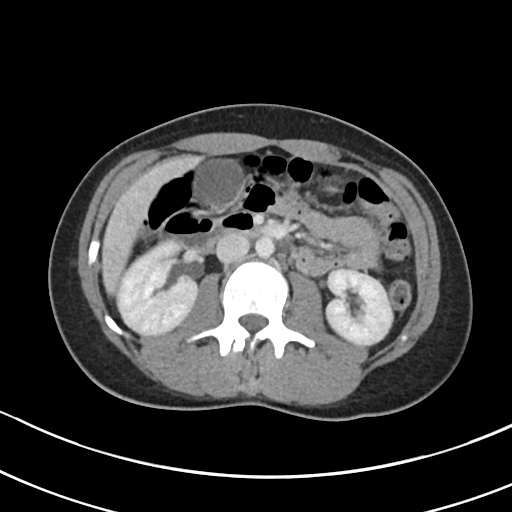
[im 64/91  soft-tissue]
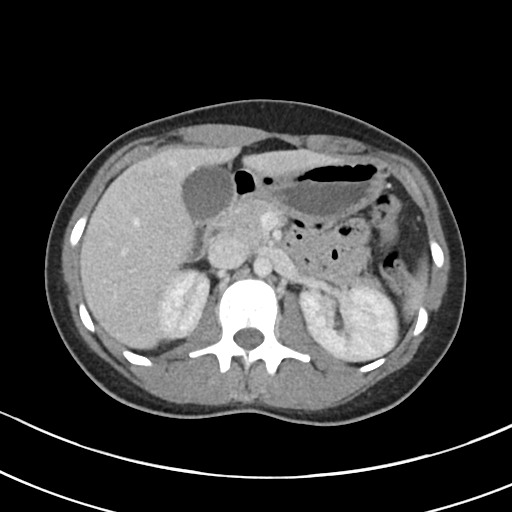
[im 64/91  bone]
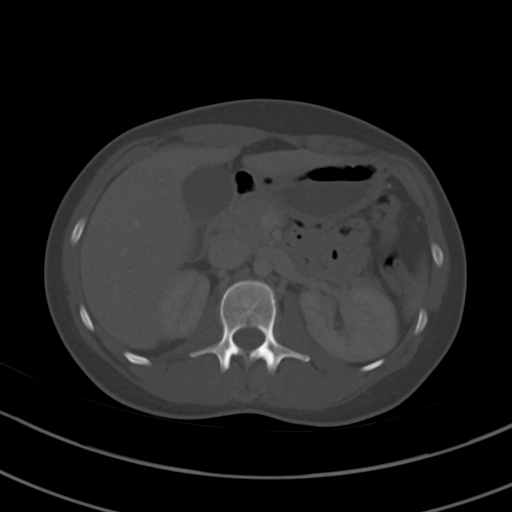
[im 72/91  soft-tissue]
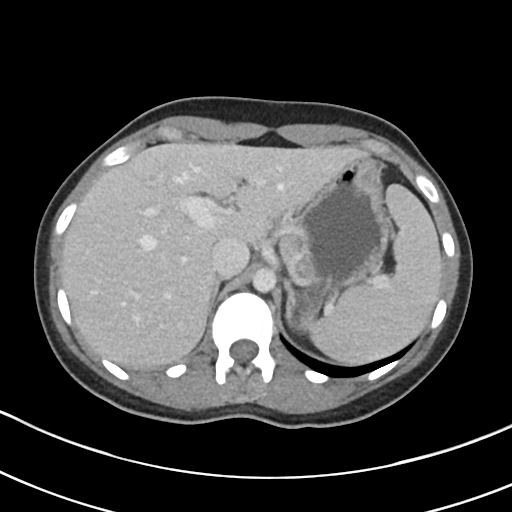
[im 79/91  soft-tissue]
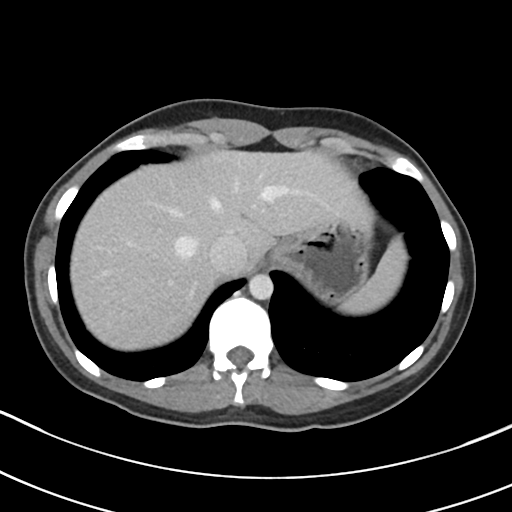
[im 87/91  soft-tissue]
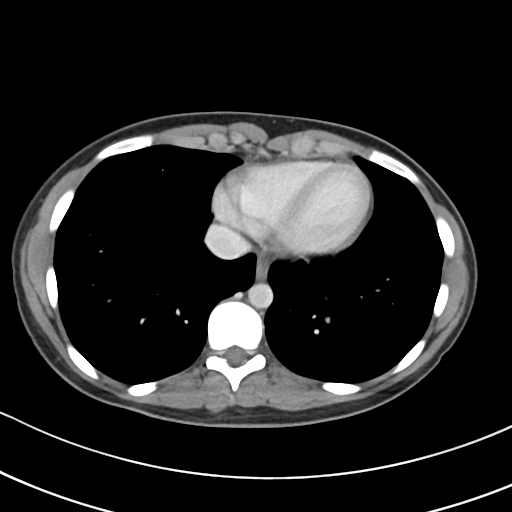

[Series 5: coronal · coronal · 0.62mm/px · 3 of 59 slices shown]
[im 20/59  soft-tissue]
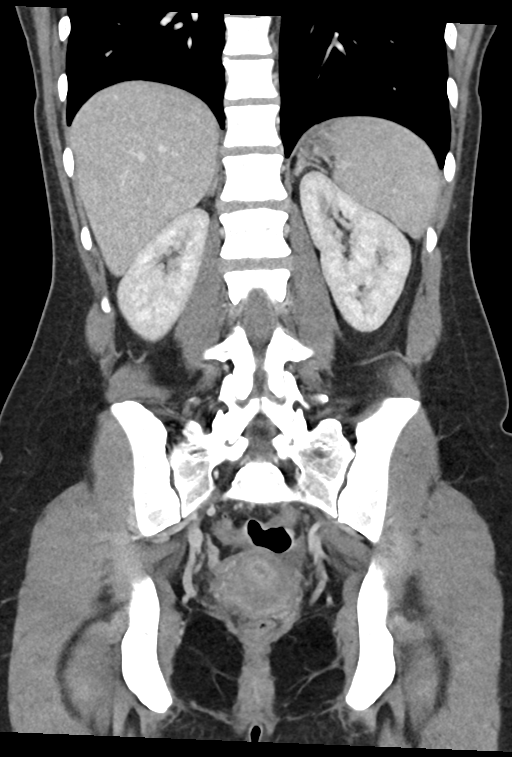
[im 26/59  soft-tissue]
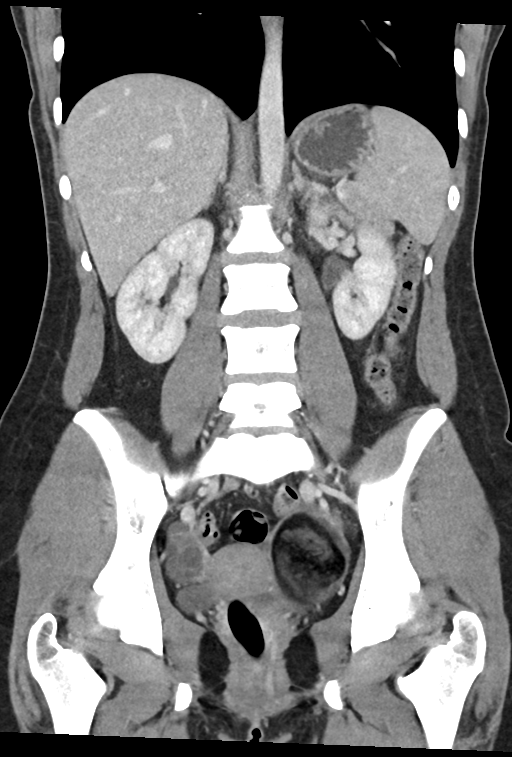
[im 33/59  soft-tissue]
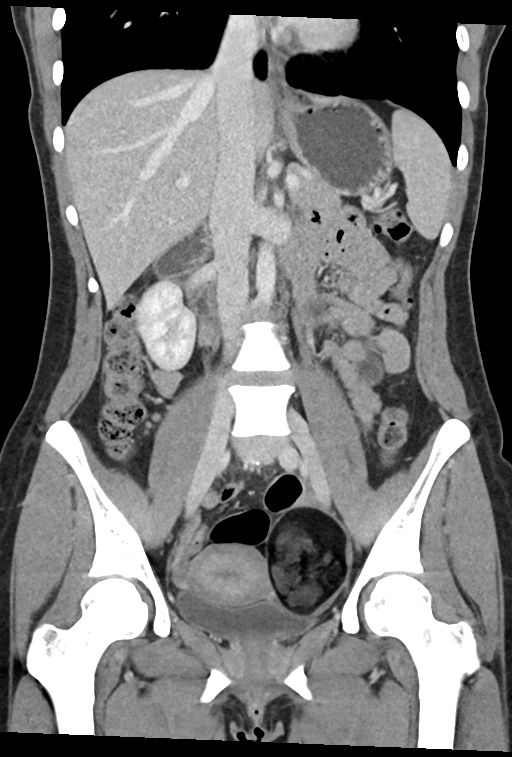

[15 of 46 positions shown; findings below may reference images not displayed]

FINDINGS: Lower chest: No acute abnormality.

Hepatobiliary: No focal liver abnormality is seen. No gallstones,
gallbladder wall thickening, or biliary dilatation.

Pancreas: Unremarkable

Spleen: Unremarkable

Adrenals/Urinary Tract: Adrenal glands are unremarkable. Kidneys are
normal, without renal calculi, focal lesion, or hydronephrosis.
Bladder is unremarkable.

Stomach/Bowel: Stomach is within normal limits. Appendix appears
normal. No evidence of bowel wall thickening, distention, or
inflammatory changes. No free intraperitoneal gas.

Vascular/Lymphatic: No significant vascular findings are present. No
enlarged abdominal or pelvic lymph nodes.

Reproductive: A macroscopic fat containing mass is again identified
within the left ovary compatible with an ovarian dermoid measuring
5.3 x 5.4 x 7.0 cm on axial image # 68 and sagittal reformat # 61.
Trace free fluid within the pelvis may be physiologic in a female
patient of this age. The pelvic organs are otherwise unremarkable.

Other: No abdominal wall hernia.  The rectum is unremarkable.

Musculoskeletal: No acute bone abnormality. No lytic or blastic bone
lesions are identified.
IMPRESSION: No acute intra-abdominal pathology identified.

Stable 7.0 cm left ovarian dermoid. As noted previously, this may
predispose to torsion and gynecologic consultation may be helpful
for further management.

## 2023-02-01 ENCOUNTER — Ambulatory Visit: Payer: BC Managed Care – PPO | Admitting: Nurse Practitioner

## 2023-02-01 ENCOUNTER — Telehealth: Payer: Self-pay | Admitting: *Deleted

## 2023-02-01 ENCOUNTER — Encounter: Payer: Self-pay | Admitting: Nurse Practitioner

## 2023-02-01 VITALS — BP 120/76 | HR 128

## 2023-02-01 DIAGNOSIS — R102 Pelvic and perineal pain: Secondary | ICD-10-CM

## 2023-02-01 DIAGNOSIS — Z86018 Personal history of other benign neoplasm: Secondary | ICD-10-CM

## 2023-02-01 DIAGNOSIS — Z9889 Other specified postprocedural states: Secondary | ICD-10-CM

## 2023-02-01 DIAGNOSIS — Z862 Personal history of diseases of the blood and blood-forming organs and certain disorders involving the immune mechanism: Secondary | ICD-10-CM | POA: Diagnosis not present

## 2023-02-01 DIAGNOSIS — N809 Endometriosis, unspecified: Secondary | ICD-10-CM

## 2023-02-01 NOTE — Progress Notes (Signed)
   Acute Office Visit  Subjective:    Patient ID: Pamela Bates, female    DOB: Nov 26, 1990, 32 y.o.   MRN: [id number]   HPI 32 y.o. G0 presents today for pelvic pain and bleeding. Started spotting 5/2, menses started 5/12 and lasted until 5/22. Bilateral lower abdominal tenderness. Nexplanon inserted by PCP March 2024, removed 1 month later due to mood changes. Has tried COCs, Nuvaring and patch as well. Patch helped with pelvic pain and menses but she had mood changes and weight gain. IUD expelled in the past. Myfembree recommended but she was worried about birth defects. Did well on Lysteda in the past but stopped because it was too expensive. Pain is intermittent, bilateral. Endometriosis suspected, lap surgery recommended previously. H/O iron deficiency anemia, iron transfusions in the past. H/O dermoid cyst removal at age 18. Not sexually active.   Patient's last menstrual period was 01/14/2023. Period Duration (Days): 10 Period Pattern: (!) Irregular Menstrual Flow: Heavy  Review of Systems  Constitutional: Negative.   Genitourinary:  Positive for menstrual problem and pelvic pain.       Objective:    Physical Exam Constitutional:      Appearance: Normal appearance.  Genitourinary:    General: Normal vulva.     Vagina: Normal.     Cervix: Normal.     Uterus: Normal.      Adnexa:        Right: Tenderness present. No mass.         Left: Tenderness present. No mass.       BP 120/76 (BP Location: Left Arm, Patient Position: Sitting, Cuff Size: Normal)   Pulse (!) 128   LMP 01/14/2023   SpO2 96%  Wt Readings from Last 3 Encounters:  04/25/22 130 lb (59 kg)  05/27/21 122 lb (55.3 kg)  04/25/21 128 lb 1.4 oz (58.1 kg)        Patient informed chaperone available to be present for breast and/or pelvic exam. Patient has requested no chaperone to be present. Patient has been advised what will be completed during breast and pelvic exam.   Assessment & Plan:   Problem  List Items Addressed This Visit   None Visit Diagnoses     Pelvic pain    -  Primary   Relevant Orders   US PELVIS TRANSVAGINAL NON-OB (TV ONLY)   Endometriosis       History of dermoid cyst excision       Relevant Orders   US PELVIS TRANSVAGINAL NON-OB (TV ONLY)   History of iron deficiency anemia       Relevant Orders   CBC with Differential/Platelet   Iron, TIBC and Ferritin Panel      Plan: Discussed option for progestin-only pill to see if the tolerates. Declines at this time. Considering surgery. Will schedule ultrasound. CBC and iron panel today. Ibuprofen 800 mg every 8 hours as needed for pain, may alternate Tylenol, heating pad.      [name] DNP, 12:32 PM 02/01/2023

## 2023-02-01 NOTE — Telephone Encounter (Signed)
-----   Message from [name], NP sent at 02/01/2023 12:29 PM EDT ----- Regarding: Pelvic ultrasound Please schedule pelvic ultrasound at [facility] and then OV with me a couple of days later to discuss. Order is in. Thanks.

## 2023-02-02 LAB — IRON,TIBC AND FERRITIN PANEL
%SAT: 24 % (calc) (ref 16–45)
Ferritin: 6 ng/mL — ABNORMAL LOW (ref 16–154)
Iron: 86 ug/dL (ref 40–190)
TIBC: 359 mcg/dL (calc) (ref 250–450)

## 2023-02-02 LAB — CBC WITH DIFFERENTIAL/PLATELET
Absolute Monocytes: 446 cells/uL (ref 200–950)
Basophils Absolute: 89 cells/uL (ref 0–200)
Basophils Relative: 1.1 %
Eosinophils Absolute: 219 cells/uL (ref 15–500)
Eosinophils Relative: 2.7 %
HCT: 37.2 % (ref 35.0–45.0)
Hemoglobin: 11.9 g/dL (ref 11.7–15.5)
Lymphs Abs: 1993 cells/uL (ref 850–3900)
MCH: 28.3 pg (ref 27.0–33.0)
MCHC: 32 g/dL (ref 32.0–36.0)
MCV: 88.4 fL (ref 80.0–100.0)
MPV: 12 fL (ref 7.5–12.5)
Monocytes Relative: 5.5 %
Neutro Abs: 5354 cells/uL (ref 1500–7800)
Neutrophils Relative %: 66.1 %
Platelets: 284 10*3/uL (ref 140–400)
RBC: 4.21 10*6/uL (ref 3.80–5.10)
RDW: 13.2 % (ref 11.0–15.0)
Total Lymphocyte: 24.6 %
WBC: 8.1 10*3/uL (ref 3.8–10.8)

## 2023-02-02 NOTE — Telephone Encounter (Signed)
Order re-entered. Pt notified and provided w/ # for centralized scheduling to call and schedule appt for [place] at location, date/time of her preference. She voiced understanding.

## 2023-02-05 ENCOUNTER — Other Ambulatory Visit: Payer: Self-pay | Admitting: Nurse Practitioner

## 2023-02-05 ENCOUNTER — Encounter: Payer: Self-pay | Admitting: Nurse Practitioner

## 2023-02-05 DIAGNOSIS — R790 Abnormal level of blood mineral: Secondary | ICD-10-CM

## 2023-02-05 MED ORDER — ACCRUFER 30 MG PO CAPS: 1.0000 | ORAL_CAPSULE | Freq: Two times a day (BID) | ORAL | 2 refills | Status: DC

## 2023-02-05 NOTE — Telephone Encounter (Signed)
Pt scheduled for external pelvic US on 02/12/2023. Will send msg to appt desk to contact to schedule OV for f/u.

## 2023-02-05 NOTE — Telephone Encounter (Signed)
Sabino Snipes, Kalman Shan, CMA Patient does not want to wait/ asked for TW to call her with results.  Will route to provider and close.

## 2023-02-05 NOTE — Telephone Encounter (Signed)
FYI. Lab recall placed to send reminder letter to pt to call and schedule appt. Will close encounter.

## 2023-02-12 ENCOUNTER — Ambulatory Visit (HOSPITAL_BASED_OUTPATIENT_CLINIC_OR_DEPARTMENT_OTHER): Payer: BC Managed Care – PPO

## 2023-02-13 ENCOUNTER — Encounter: Payer: Self-pay | Admitting: Nurse Practitioner

## 2023-02-14 NOTE — Telephone Encounter (Signed)
Ibuprofen 800mg  every 8 hours, alternate with tylenol in between doses. We can check for a cancellation here or outside facility if insurance will cover.

## 2023-03-23 ENCOUNTER — Telehealth: Payer: Self-pay

## 2023-03-23 NOTE — Telephone Encounter (Signed)
Correspondence received from [employer] regarding initiating a PA for pt's Accrufer Rx.   PA initiated through CoverMyMeds. ZOX:[id number]  Initial request received response of: "Can your patient's tx be switched to a formulary drug?"  Alternatives not provided. However, per mychart msg dated 02/05/2023-barely able to tolerate OTC iron so recommended accrufer.  Ok to report unable to take alternatives and move forward with PA? Please advise.

## 2023-03-26 NOTE — Telephone Encounter (Signed)
PA sent to plan

## 2023-03-26 NOTE — Telephone Encounter (Signed)
Yes, please specify that she has not tolerated alternatives and move forward with PA. Thanks.

## 2023-03-27 NOTE — Telephone Encounter (Signed)
PA Approved as of 03/26/2023. [employer] notified of approval and expiration date of 1 year (03/25/2024). Will route to provider for final review and close.

## 2023-03-29 ENCOUNTER — Encounter: Payer: Self-pay | Admitting: Nurse Practitioner

## 2023-03-29 ENCOUNTER — Ambulatory Visit (INDEPENDENT_AMBULATORY_CARE_PROVIDER_SITE_OTHER): Payer: BC Managed Care – PPO

## 2023-03-29 ENCOUNTER — Other Ambulatory Visit: Payer: Self-pay | Admitting: Nurse Practitioner

## 2023-03-29 ENCOUNTER — Ambulatory Visit: Payer: BC Managed Care – PPO | Admitting: Nurse Practitioner

## 2023-03-29 VITALS — BP 110/70 | HR 97

## 2023-03-29 DIAGNOSIS — R102 Pelvic and perineal pain: Secondary | ICD-10-CM | POA: Diagnosis not present

## 2023-03-29 DIAGNOSIS — D271 Benign neoplasm of left ovary: Secondary | ICD-10-CM | POA: Diagnosis not present

## 2023-03-29 DIAGNOSIS — Z86018 Personal history of other benign neoplasm: Secondary | ICD-10-CM

## 2023-03-29 NOTE — Progress Notes (Signed)
   Acute Office Visit  Subjective:    Patient ID: Pamela Bates, female    DOB: 1990-09-12, 32 y.o.   MRN: [id number]   HPI 32 y.o. G0 presents today for ultrasound for pelvic pain. H/O suspected endometriosis and dermoid cyst. Has failed many treatments - Nexplanon inserted by PCP March 2024, removed 1 month later due to mood changes, tried COCs, Nuvaring and patch as well with no improvement in pain. Patch helped with pelvic pain and menses but she had mood changes and weight gain, IUD expelled. Myfembree recommended but she was worried about birth defects. Did well on Lysteda in the past but stopped because it was too expensive. Pain is intermittent, bilateral. Endometriosis suspected, lap surgery recommended previously. H/O iron deficiency anemia, iron transfusions in the past. Started on Accufer due to low ferritin. H/O dermoid cyst removal at age 32 but during surgery cyst not present. CT scan 03/2021 showed 7.0 cm left dermoid cyst. Not sexually active.   Patient's last menstrual period was 03/04/2023.    Review of Systems  Constitutional: Negative.   Genitourinary:  Positive for menstrual problem and pelvic pain.       Objective:    Physical Exam Constitutional:      Appearance: Normal appearance.   GU: Not indicated  BP 110/70   Pulse 97   LMP 03/04/2023   SpO2 100%  Wt Readings from Last 3 Encounters:  04/25/22 130 lb (59 kg)  05/27/21 122 lb (55.3 kg)  04/25/21 128 lb 1.4 oz (58.1 kg)        Assessment & Plan:   Problem List Items Addressed This Visit   None Visit Diagnoses     Cyst, ovary, dermoid, left    -  Primary   Relevant Orders   Ambulatory referral to Obstetrics / Gynecology   Pelvic pain          Comparison is made with CT scan July 2022:  Vaginal ultrasound: Anteverted uterus, normal size and shape, no myometrial masses.  Symmetrical secretory endometrium - 10 mm. No masses or thickening seen.  Right ovary normal size, normal perfusion,  with 22 x 17 mm resolving corpus luteum - cycle day 26.  Left adnexal avascular cystic/solid mass with dermoid appearance measuring 8.8 x 7.3 x 6.3 cm (larger than seen on prior CT scan at 7.0 cm).  No free fluid.  Plan: Ultrasound reviewed with patient. Left ovarian dermoid cyst present and increased in size at 8.8 cm. Surgical consult recommended. Urgent referral place to Dr. Estanislado Pandy and card provided to patient. Educated on risk for torsion, S/S and when to present to ER.      Pamela Mackie DNP, 9:03 AM 03/29/2023

## 2023-04-06 ENCOUNTER — Encounter: Payer: Self-pay | Admitting: Nurse Practitioner

## 2023-04-06 NOTE — Telephone Encounter (Signed)
Pamela Bates -please confirm records were sent with referral and assist with any additional records needed.   Thank you!

## 2023-04-12 DIAGNOSIS — D279 Benign neoplasm of unspecified ovary: Secondary | ICD-10-CM | POA: Diagnosis not present

## 2023-04-12 DIAGNOSIS — N920 Excessive and frequent menstruation with regular cycle: Secondary | ICD-10-CM | POA: Diagnosis not present

## 2023-04-12 DIAGNOSIS — D509 Iron deficiency anemia, unspecified: Secondary | ICD-10-CM | POA: Diagnosis not present

## 2023-04-12 DIAGNOSIS — Z139 Encounter for screening, unspecified: Secondary | ICD-10-CM | POA: Diagnosis not present

## 2023-04-12 DIAGNOSIS — R102 Pelvic and perineal pain: Secondary | ICD-10-CM | POA: Diagnosis not present

## 2023-04-20 DIAGNOSIS — G47419 Narcolepsy without cataplexy: Secondary | ICD-10-CM | POA: Diagnosis not present

## 2023-04-20 DIAGNOSIS — J029 Acute pharyngitis, unspecified: Secondary | ICD-10-CM | POA: Diagnosis not present

## 2023-04-20 DIAGNOSIS — J45990 Exercise induced bronchospasm: Secondary | ICD-10-CM | POA: Diagnosis not present

## 2023-05-03 NOTE — H&P (Addendum)
Pamela Bates is a 32 y.o. female, G:0 presents for ovarian cystectomy because of a left ovarian dermoid on ultrasound. The patient reports chronic pelvic pain for 10 years that has worsened in the past 2 years. Her pain is with menses and for at least 10 additional days out of the month. She rates the pain as 10/10 with minimal relief from over the counter analgesia. When she has her menses, her flow is for 12 days with hourly pad plus tampon change most days.  She admits to  clots On occasion she will experience intermenstrual bleeding that has the same flow and pelvic pain profile as her menses but will only last 3 days. In the past she has been treated with and failed: Nexplanon, combination oral contraceptives, Nuvaring, Xulane Patch and  had minimal decrease in menstrual flow with Lysteda.  A Mirena IUD placed in the past spontaneously expelled within 10 hours of insertion. The patient has a history of anemia requiring iron infusions. Fortunately she has been able to maintain an acceptable iron level with oral iron.  Since April 12, 2023 she has been taking norethindrone 5 mg with some decrease in bleeding and the frequency of pain but reports constipation. She denies changes in urination, vaginitis symptoms or  dyspareunia.   A pelvic ultrasound on March 29, 2023 revealed a left ovarian dermoid cyst (8.8 cm) with otherwise normal uterus, myometrium/endometrium and right ovary.  Given the chronicity of her pelvic pain, abnormal uterine bleeding and findings on ultrasound, the patient has decided to proceed with surgical removal of ovarian cyst and evaluation for endometriosis.  Past Medical History  OB History: G:0  GYN History: menarche:32 YO;     LMP: 04/03/2023;    Contraception: none;  Denies history of abnormal PAP smear.  Last PAP smear: 2023 normal  Medical History: Narcolepsy, Anemia, History of Left Dermoid, Migraine, PASH-right breast  Surgical History:  2008 Laparoscopic Left Ovarian Cyst  (not found during surgery); 2023 Right Breast Biopsy and 2023 Tonsillectomy Denies problems with anesthesia except for post operative depression or history of blood transfusions  Family History: Breast Cancer, Hypertension, Ovarian Cancer, Dementia and Heart Disease  Social History: Single and employed in [employer]; Denies tobacco use and occasionally consumes alcohol   Medication: Norethindrone 5 mg daily Ibuprofen 600 mg with food every 6 hours prn Adderall 10 mg daily Accrufer 30 mg bid   No Known Allergies  Denies sensitivity to peanuts, shellfish, soy or latex but does have some skin irritation with band-aids (no problems with steri-strips)    ROS: Admits to migraine headaches four times a year,  since norethindrone: random lightheadedness, itchiness & constipation,  deviated septum so nasal congestion due to nasal polyps,  arm and thigh aches twice weekly but  denies vision changes, dysphagia, tinnitus, hoarseness, cough,  chest pain, shortness of breath, nausea, vomiting, diarrhea,  urinary frequency, urgency  dysuria, hematuria, vaginitis symptoms,  swelling of joints,easy bruising,  arthralgias, skin rashes, unexplained weight loss and except as is mentioned in the history of present illness, patient's review of systems is otherwise negative.    Physical Exam  Bp: 126/66;  Weight: 139 lbs.;  Height: 5'4"; BMI: 23.9  Neck: supple without masses or thyromegaly Lungs: clear to auscultation Heart: regular rate and rhythm Abdomen: soft, tenderness without guarding in both lower quadrants (left > right)  and no organomegaly Pelvic: patient declined exam Extremities:  no clubbing, cyanosis or edema   Assesment:  Chronic Pelvic Pain  Left Ovarian Dermoid Cyst   Disposition:  A Robot Assisted Ovarian Cystectomy  benefits include lesser postoperative pain, less blood loss during surgery, reduced risk of injury to other organs due to better visualization  with a 3-D HD 10 times magnifying camera, shorter hospital stay between 0-1 night and rapid recovery with return to daily routine in 1-2 weeks. Although the robot-assisted ovarian cystectomy has a longer operative time than traditional laparotomy, in a patient with good medical history, the benefits usually outweigh the risks.  Risks include but are not limited to bleeding, infection, injury to other organs, need for laparotomy, and transient post-operative facial edema. A Miralax Bowel prep was given to be completed the day before surgery.  The patient verbalized understanding of these risks and has consented to proceed with a Robot Assisted Ovarian Cystectomy  and Possible Resection of Endometriosis at [facility] on June 07, 2023.  CSN# [id number]   Dalynn Jhaveri J. Lowell Guitar, PA-C  for Dr. Crist Fat. Rivard

## 2023-05-10 DIAGNOSIS — N920 Excessive and frequent menstruation with regular cycle: Secondary | ICD-10-CM | POA: Diagnosis not present

## 2023-05-10 DIAGNOSIS — D279 Benign neoplasm of unspecified ovary: Secondary | ICD-10-CM | POA: Diagnosis not present

## 2023-05-10 DIAGNOSIS — R102 Pelvic and perineal pain: Secondary | ICD-10-CM | POA: Diagnosis not present

## 2023-05-30 ENCOUNTER — Encounter (HOSPITAL_BASED_OUTPATIENT_CLINIC_OR_DEPARTMENT_OTHER): Payer: Self-pay | Admitting: Obstetrics and Gynecology

## 2023-05-30 ENCOUNTER — Other Ambulatory Visit: Payer: Self-pay

## 2023-05-30 NOTE — Progress Notes (Signed)
Spoke w/ via phone for pre-op interview---pt Lab needs dos----urine preg        Lab results------lab appt 06-01-2023 230 pm for cbc t & s COVID test -----patient states asymptomatic no test needed Arrive at -------1030 06-07-2023 NPO after MN NO Solid Food.  Clear liquids from MN until---930 Med rec completed Medications to take morning of surgery -----none Diabetic medication -----n/a Patient instructed no nail polish to be worn day of surgery Patient instructed to bring photo id and insurance card day of surgery Patient aware to have Driver (ride ) / caregiver    for 24 hours after surgery -  Patient Special Instructions ----- drink ensure drink at 915 am Pre-Op special Instructions -----none Patient verbalized understanding of instructions that were given at this phone interview. Patient denies chest pain, sob, fever, cough at the interview.

## 2023-05-30 NOTE — Progress Notes (Addendum)
Your procedure is scheduled on 06-07-2023  Report to [facility] AT  1030 AM.   Call this number if you have problems the morning of surgery  :[phone].   OUR ADDRESS IS [address].  WE ARE LOCATED IN [facility].  PLEASE BRING YOUR INSURANCE CARD AND PHOTO ID DAY OF SURGERY.  ONLY 2 PEOPLE ARE ALLOWED IN  WAITING  ROOM                                      REMEMBER:  NO SOLID FOOD AFTER MIDNIGHT THE NIGHT PRIOR TO SURGERY. NOTHING BY MOUTH EXCEPT CLEAR LIQUIDS UNTIL 930 AM. AT THIS TIME 930 AM DRINK ENSURE PRESURGERY CARBOHYDRATE DRINK, THE ENSURE PRE SURGERY DRINK  NEEDS TO BE COMPLETED AT 930 AM. AFTER 930 AM NOTHING BY MOUTH INCLUDING WATER, CANDY, GUM OR MINTS.  YOU MAY  BRUSH YOUR TEETH MORNING OF SURGERY AND RINSE YOUR MOUTH OUT, NO CHEWING GUM CANDY OR MINTS.     CLEAR LIQUID DIET    Allowed      Water                                                                   Coffee and tea, regular and decaf  (NO cream or milk products of any type, may sweeten)                         Carbonated beverages, regular and diet                                    Sports drinks like Gatorade _____________________________________________________________________     TAKE ONLY THESE MEDICATIONS MORNING OF SURGERY:NONE                                        DO NOT WEAR JEWERLY/  METAL/  PIERCINGS (INCLUDING NO PLASTIC PIERCINGS) DO NOT WEAR LOTIONS, POWDERS, PERFUMES OR NAIL POLISH ON YOUR FINGERNAILS. TOENAIL POLISH IS OK TO WEAR. DO NOT SHAVE FOR 48 HOURS PRIOR TO DAY OF SURGERY.  CONTACTS, GLASSES, OR DENTURES MAY NOT BE WORN TO SURGERY.  REMEMBER: NO SMOKING, VAPING ,  DRUGS OR ALCOHOL FOR 24 HOURS BEFORE YOUR SURGERY.                                    [facility] IS NOT RESPONSIBLE  FOR ANY BELONGINGS.                                                                    Marland Kitchen          Marland Kitchen  YOUR SURGEON MAY HAVE REQUESTED  EXTENDED RECOVERY TIME AFTER YOUR SURGERY. IT COULD BE A  JUST A FEW HOURS  UP TO AN OVERNIGHT STAY.  YOUR SURGEON SHOULD HAVE DISCUSSED THIS WITH YOU PRIOR TO YOUR SURGERY. IN THE EVENT YOU NEED TO STAY OVERNIGHT PLEASE REFER TO THE FOLLOWING GUIDELINES. YOU MAY HAVE UP TO 4 VISITORS  MAY VISIT IN THE EXTENDED RECOVERY ROOM UNTIL 800 PM ONLY.  ONE  VISITOR AGE 25 AND OVER MAY SPEND THE NIGHT AND MUST BE IN EXTENDED RECOVERY ROOM NO LATER THAN 800 PM . YOUR DISCHARGE TIME AFTER YOU SPEND THE NIGHT IS 900 AM THE MORNING AFTER YOUR SURGERY. YOU MAY PACK A SMALL OVERNIGHT BAG WITH TOILETRIES FOR YOUR OVERNIGHT STAY IF YOU WISH.  REGARDLESS OF IF YOU STAY OVER NIGHT OR ARE DISCHARGED THE SAME DAY YOU WILL BE REQUIRED TO HAVE A RESPONSIBLE ADULT (18 YRS OLD OR OLDER) STAY WITH YOU FOR AT LEAST THE FIRST 24 HOURS  YOUR PRESCRIPTION MEDICATIONS WILL BE PROVIDED DURING YOUR HOSPITAL STAY.  ________________________________________________________________________                                                        QUESTIONS Annye Asa  PRE OP NURSE PHONE [phone].

## 2023-06-01 ENCOUNTER — Encounter (HOSPITAL_COMMUNITY)
Admission: RE | Admit: 2023-06-01 | Discharge: 2023-06-01 | Disposition: A | Discharge status: Home / Self Care | Payer: BC Managed Care – PPO | Source: Ambulatory Visit | Attending: Obstetrics and Gynecology | Admitting: Obstetrics and Gynecology

## 2023-06-01 DIAGNOSIS — N393 Stress incontinence (female) (male): Secondary | ICD-10-CM | POA: Diagnosis not present

## 2023-06-01 DIAGNOSIS — D509 Iron deficiency anemia, unspecified: Secondary | ICD-10-CM | POA: Diagnosis not present

## 2023-06-01 DIAGNOSIS — G8929 Other chronic pain: Secondary | ICD-10-CM | POA: Diagnosis not present

## 2023-06-01 DIAGNOSIS — R102 Pelvic and perineal pain: Secondary | ICD-10-CM | POA: Diagnosis not present

## 2023-06-01 DIAGNOSIS — D279 Benign neoplasm of unspecified ovary: Secondary | ICD-10-CM | POA: Insufficient documentation

## 2023-06-01 DIAGNOSIS — N939 Abnormal uterine and vaginal bleeding, unspecified: Secondary | ICD-10-CM

## 2023-06-01 DIAGNOSIS — Z01818 Encounter for other preprocedural examination: Secondary | ICD-10-CM | POA: Diagnosis not present

## 2023-06-01 DIAGNOSIS — Z01812 Encounter for preprocedural laboratory examination: Secondary | ICD-10-CM | POA: Insufficient documentation

## 2023-06-01 LAB — CBC
HCT: 38.9 % (ref 36.0–46.0)
Hemoglobin: 12.6 g/dL (ref 12.0–15.0)
MCH: 29.9 pg (ref 26.0–34.0)
MCHC: 32.4 g/dL (ref 30.0–36.0)
MCV: 92.4 fL (ref 80.0–100.0)
Platelets: 312 10*3/uL (ref 150–400)
RBC: 4.21 MIL/uL (ref 3.87–5.11)
RDW: 14.1 % (ref 11.5–15.5)
WBC: 9.3 10*3/uL (ref 4.0–10.5)
nRBC: 0 % (ref 0.0–0.2)

## 2023-06-07 ENCOUNTER — Ambulatory Visit (HOSPITAL_BASED_OUTPATIENT_CLINIC_OR_DEPARTMENT_OTHER): Payer: BC Managed Care – PPO | Admitting: Anesthesiology

## 2023-06-07 ENCOUNTER — Ambulatory Visit (HOSPITAL_BASED_OUTPATIENT_CLINIC_OR_DEPARTMENT_OTHER)
Admission: RE | Admit: 2023-06-07 | Discharge: 2023-06-07 | Disposition: A | Discharge status: Home / Self Care | Payer: BC Managed Care – PPO | Attending: Obstetrics and Gynecology | Admitting: Obstetrics and Gynecology

## 2023-06-07 ENCOUNTER — Encounter (HOSPITAL_BASED_OUTPATIENT_CLINIC_OR_DEPARTMENT_OTHER): Payer: Self-pay | Admitting: Obstetrics and Gynecology

## 2023-06-07 ENCOUNTER — Other Ambulatory Visit: Payer: Self-pay

## 2023-06-07 ENCOUNTER — Encounter (HOSPITAL_BASED_OUTPATIENT_CLINIC_OR_DEPARTMENT_OTHER)
Admission: RE | Disposition: A | Discharge status: Home / Self Care | Payer: Self-pay | Source: Home / Self Care | Attending: Obstetrics and Gynecology

## 2023-06-07 DIAGNOSIS — D509 Iron deficiency anemia, unspecified: Secondary | ICD-10-CM

## 2023-06-07 DIAGNOSIS — R102 Pelvic and perineal pain unspecified side: Secondary | ICD-10-CM

## 2023-06-07 DIAGNOSIS — N803 Endometriosis of pelvic peritoneum, unspecified: Secondary | ICD-10-CM | POA: Diagnosis not present

## 2023-06-07 DIAGNOSIS — D271 Benign neoplasm of left ovary: Secondary | ICD-10-CM

## 2023-06-07 DIAGNOSIS — N809 Endometriosis, unspecified: Secondary | ICD-10-CM | POA: Diagnosis not present

## 2023-06-07 DIAGNOSIS — G8929 Other chronic pain: Secondary | ICD-10-CM | POA: Insufficient documentation

## 2023-06-07 DIAGNOSIS — N939 Abnormal uterine and vaginal bleeding, unspecified: Secondary | ICD-10-CM

## 2023-06-07 DIAGNOSIS — D279 Benign neoplasm of unspecified ovary: Secondary | ICD-10-CM

## 2023-06-07 HISTORY — DX: Anxiety disorder, unspecified: F41.9

## 2023-06-07 HISTORY — DX: Other complications of anesthesia, initial encounter: T88.59XA

## 2023-06-07 HISTORY — DX: Unspecified asthma, uncomplicated: J45.909

## 2023-06-07 HISTORY — DX: Depression, unspecified: F32.A

## 2023-06-07 HISTORY — PX: ROBOTIC ASSISTED LAPAROSCOPIC OVARIAN CYSTECTOMY: SHX6081

## 2023-06-07 HISTORY — DX: Family history of other specified conditions: Z84.89

## 2023-06-07 LAB — TYPE AND SCREEN
ABO/RH(D): O POS
Antibody Screen: NEGATIVE

## 2023-06-07 LAB — ABO/RH: ABO/RH(D): O POS

## 2023-06-07 LAB — POCT PREGNANCY, URINE: Preg Test, Ur: NEGATIVE

## 2023-06-07 SURGERY — EXCISION, CYST, OVARY, ROBOT-ASSISTED, LAPAROSCOPIC
Anesthesia: General | Site: Pelvis

## 2023-06-07 MED ORDER — DEXAMETHASONE SODIUM PHOSPHATE 10 MG/ML IJ SOLN
INTRAMUSCULAR | Status: DC | PRN
  Administered 2023-06-07: 10 mg via INTRAVENOUS

## 2023-06-07 MED ORDER — PROPOFOL 10 MG/ML IV BOLUS
INTRAVENOUS | Status: AC
  Filled 2023-06-07: qty 20

## 2023-06-07 MED ORDER — OXYCODONE HCL 5 MG PO TABS
ORAL_TABLET | ORAL | Status: AC
  Filled 2023-06-07: qty 1

## 2023-06-07 MED ORDER — VASOPRESSIN 20 UNIT/ML IV SOLN
INTRAVENOUS | Status: DC | PRN
  Administered 2023-06-07: 10 mL via INTRAMUSCULAR

## 2023-06-07 MED ORDER — LACTATED RINGERS IV SOLN: INTRAVENOUS | Status: DC

## 2023-06-07 MED ORDER — SODIUM CHLORIDE 0.9 % IR SOLN
Status: DC | PRN
  Administered 2023-06-07: 1000 mL

## 2023-06-07 MED ORDER — LIDOCAINE HCL (PF) 2 % IJ SOLN
INTRAMUSCULAR | Status: AC
  Filled 2023-06-07: qty 5

## 2023-06-07 MED ORDER — FENTANYL CITRATE (PF) 100 MCG/2ML IJ SOLN
INTRAMUSCULAR | Status: DC | PRN
  Administered 2023-06-07: 100 ug via INTRAVENOUS

## 2023-06-07 MED ORDER — EPHEDRINE SULFATE-NACL 50-0.9 MG/10ML-% IV SOSY
PREFILLED_SYRINGE | INTRAVENOUS | Status: DC | PRN
  Administered 2023-06-07: 5 mg via INTRAVENOUS

## 2023-06-07 MED ORDER — ONDANSETRON HCL 4 MG/2ML IJ SOLN: 4.0000 mg | Freq: Once | INTRAMUSCULAR | Status: DC | PRN

## 2023-06-07 MED ORDER — GABAPENTIN 300 MG PO CAPS
ORAL_CAPSULE | ORAL | Status: AC
  Filled 2023-06-07: qty 1

## 2023-06-07 MED ORDER — FENTANYL CITRATE (PF) 100 MCG/2ML IJ SOLN
INTRAMUSCULAR | Status: AC
  Filled 2023-06-07: qty 2

## 2023-06-07 MED ORDER — ROCURONIUM BROMIDE 10 MG/ML (PF) SYRINGE
PREFILLED_SYRINGE | INTRAVENOUS | Status: AC
  Filled 2023-06-07: qty 10

## 2023-06-07 MED ORDER — DEXAMETHASONE SODIUM PHOSPHATE 10 MG/ML IJ SOLN
INTRAMUSCULAR | Status: AC
  Filled 2023-06-07: qty 1

## 2023-06-07 MED ORDER — SCOPOLAMINE 1 MG/3DAYS TD PT72
1.0000 | MEDICATED_PATCH | TRANSDERMAL | Status: DC
  Administered 2023-06-07: 1.5 mg via TRANSDERMAL

## 2023-06-07 MED ORDER — MIDAZOLAM HCL 5 MG/5ML IJ SOLN
INTRAMUSCULAR | Status: DC | PRN
  Administered 2023-06-07: 2 mg via INTRAVENOUS

## 2023-06-07 MED ORDER — KETOROLAC TROMETHAMINE 30 MG/ML IJ SOLN
INTRAMUSCULAR | Status: AC
  Filled 2023-06-07: qty 1

## 2023-06-07 MED ORDER — OXYCODONE HCL 5 MG/5ML PO SOLN: 5.0000 mg | Freq: Once | ORAL | Status: AC | PRN

## 2023-06-07 MED ORDER — LIDOCAINE 2% (20 MG/ML) 5 ML SYRINGE
INTRAMUSCULAR | Status: DC | PRN
  Administered 2023-06-07: 80 mg via INTRAVENOUS

## 2023-06-07 MED ORDER — ENSURE PRE-SURGERY PO LIQD: 296.0000 mL | Freq: Once | ORAL | Status: DC

## 2023-06-07 MED ORDER — CELECOXIB 200 MG PO CAPS
400.0000 mg | ORAL_CAPSULE | ORAL | Status: AC
  Administered 2023-06-07: 400 mg via ORAL

## 2023-06-07 MED ORDER — ACETAMINOPHEN 500 MG PO TABS
ORAL_TABLET | ORAL | Status: AC
  Filled 2023-06-07: qty 2

## 2023-06-07 MED ORDER — SUGAMMADEX SODIUM 200 MG/2ML IV SOLN
INTRAVENOUS | Status: DC | PRN
  Administered 2023-06-07: 150 mg via INTRAVENOUS

## 2023-06-07 MED ORDER — DEXMEDETOMIDINE HCL IN NACL 80 MCG/20ML IV SOLN
INTRAVENOUS | Status: DC | PRN
  Administered 2023-06-07 (×5): 4 ug via INTRAVENOUS

## 2023-06-07 MED ORDER — POVIDONE-IODINE 10 % EX SWAB: 2.0000 | Freq: Once | CUTANEOUS | Status: DC

## 2023-06-07 MED ORDER — PROPOFOL 10 MG/ML IV BOLUS
INTRAVENOUS | Status: DC | PRN
  Administered 2023-06-07: 120 mg via INTRAVENOUS

## 2023-06-07 MED ORDER — GABAPENTIN 300 MG PO CAPS
300.0000 mg | ORAL_CAPSULE | ORAL | Status: AC
  Administered 2023-06-07: 300 mg via ORAL

## 2023-06-07 MED ORDER — SODIUM CHLORIDE 0.9 % IV SOLN
INTRAVENOUS | Status: DC | PRN
  Administered 2023-06-07: 60 mL
  Administered 2023-06-07: 10 mL
  Administered 2023-06-07: 30 mL

## 2023-06-07 MED ORDER — FENTANYL CITRATE (PF) 100 MCG/2ML IJ SOLN
25.0000 ug | INTRAMUSCULAR | Status: DC | PRN
  Administered 2023-06-07 (×2): 25 ug via INTRAVENOUS

## 2023-06-07 MED ORDER — SCOPOLAMINE 1 MG/3DAYS TD PT72
MEDICATED_PATCH | TRANSDERMAL | Status: AC
  Filled 2023-06-07: qty 1

## 2023-06-07 MED ORDER — MIDAZOLAM HCL 2 MG/2ML IJ SOLN
INTRAMUSCULAR | Status: AC
  Filled 2023-06-07: qty 2

## 2023-06-07 MED ORDER — CELECOXIB 200 MG PO CAPS
ORAL_CAPSULE | ORAL | Status: AC
  Filled 2023-06-07: qty 2

## 2023-06-07 MED ORDER — ROCURONIUM BROMIDE 10 MG/ML (PF) SYRINGE
PREFILLED_SYRINGE | INTRAVENOUS | Status: DC | PRN
  Administered 2023-06-07: 10 mg via INTRAVENOUS
  Administered 2023-06-07: 60 mg via INTRAVENOUS

## 2023-06-07 MED ORDER — STERILE WATER FOR IRRIGATION IR SOLN
Status: DC | PRN
  Administered 2023-06-07: 500 mL

## 2023-06-07 MED ORDER — ONDANSETRON HCL 4 MG/2ML IJ SOLN
INTRAMUSCULAR | Status: DC | PRN
  Administered 2023-06-07: 4 mg via INTRAVENOUS

## 2023-06-07 MED ORDER — KETOROLAC TROMETHAMINE 30 MG/ML IJ SOLN
30.0000 mg | Freq: Once | INTRAMUSCULAR | Status: AC
  Administered 2023-06-07: 30 mg via INTRAVENOUS

## 2023-06-07 MED ORDER — ONDANSETRON HCL 4 MG/2ML IJ SOLN
INTRAMUSCULAR | Status: AC
  Filled 2023-06-07: qty 2

## 2023-06-07 MED ORDER — OXYCODONE HCL 5 MG PO TABS
5.0000 mg | ORAL_TABLET | Freq: Once | ORAL | Status: AC | PRN
  Administered 2023-06-07: 5 mg via ORAL

## 2023-06-07 MED ORDER — ACETAMINOPHEN 500 MG PO TABS
1000.0000 mg | ORAL_TABLET | ORAL | Status: AC
  Administered 2023-06-07: 1000 mg via ORAL

## 2023-06-07 SURGICAL SUPPLY — 79 items
ADH SKN CLS APL DERMABOND .7 (GAUZE/BANDAGES/DRESSINGS) ×2
BAG DRN RND TRDRP ANRFLXCHMBR (UROLOGICAL SUPPLIES) ×2
BAG URINE DRAIN 2000ML AR STRL (UROLOGICAL SUPPLIES) ×2 IMPLANT
BARRIER ADHS 3X4 INTERCEED (GAUZE/BANDAGES/DRESSINGS) ×2 IMPLANT
BRR ADH 4X3 ABS CNTRL BYND (GAUZE/BANDAGES/DRESSINGS)
CATH FOLEY 3WAY 5CC 16FR (CATHETERS) ×2 IMPLANT
COVER BACK TABLE 60X90IN (DRAPES) ×2 IMPLANT
COVER TIP SHEARS 8 DVNC (MISCELLANEOUS) ×2 IMPLANT
DEFOGGER SCOPE WARMER CLEARIFY (MISCELLANEOUS) ×2 IMPLANT
DERMABOND ADVANCED .7 DNX12 (GAUZE/BANDAGES/DRESSINGS) ×2 IMPLANT
DILATOR CANAL MILEX (MISCELLANEOUS) IMPLANT
DRAPE ARM DVNC X/XI (DISPOSABLE) ×8 IMPLANT
DRAPE COLUMN DVNC XI (DISPOSABLE) ×2 IMPLANT
DRAPE SURG IRRIG POUCH 19X23 (DRAPES) ×2 IMPLANT
DRAPE UTILITY XL STRL (DRAPES) ×2 IMPLANT
DRIVER NDL MEGA SUTCUT DVNCXI (INSTRUMENTS) ×2 IMPLANT
DRIVER NDLE MEGA SUTCUT DVNCXI (INSTRUMENTS) ×2
DRSG TELFA 3X8 NADH STRL (GAUZE/BANDAGES/DRESSINGS) IMPLANT
DURAPREP 26ML APPLICATOR (WOUND CARE) ×2 IMPLANT
ELECT REM PT RETURN 9FT ADLT (ELECTROSURGICAL) ×2
ELECTRODE REM PT RTRN 9FT ADLT (ELECTROSURGICAL) ×2 IMPLANT
FORCEPS BPLR 8 MD DVNC XI (FORCEP) IMPLANT
FORCEPS BPLR LNG DVNC XI (INSTRUMENTS) ×2 IMPLANT
FORCEPS LONG TIP 8 DVNC XI (FORCEP) ×2 IMPLANT
FORCEPS MICRO DMD BLK DVNC XI (FORCEP) IMPLANT
FORCEPS PROGRASP DVNC XI (FORCEP) ×2 IMPLANT
GAUZE 4X4 16PLY ~~LOC~~+RFID DBL (SPONGE) IMPLANT
GAUZE PETROLATUM 1 X8 (GAUZE/BANDAGES/DRESSINGS) ×2 IMPLANT
GLOVE BIO SURGEON STRL SZ7 (GLOVE) ×2 IMPLANT
GLOVE BIOGEL PI IND STRL 6.5 (GLOVE) IMPLANT
GLOVE BIOGEL PI IND STRL 7.0 (GLOVE) ×6 IMPLANT
GLOVE ECLIPSE 6.5 STRL STRAW (GLOVE) ×6 IMPLANT
GLOVE SURG SS PI 6.0 STRL IVOR (GLOVE) IMPLANT
GRASPER COBRA DVNC RU (INSTRUMENTS) IMPLANT
HOLDER FOLEY CATH W/STRAP (MISCELLANEOUS) IMPLANT
IRRIG SUCT STRYKERFLOW 2 WTIP (MISCELLANEOUS) ×2
IRRIGATION SUCT STRKRFLW 2 WTP (MISCELLANEOUS) ×2 IMPLANT
IV NS 1000ML (IV SOLUTION) ×2
IV NS 1000ML BAXH (IV SOLUTION) IMPLANT
KIT PINK PAD W/HEAD ARE REST (MISCELLANEOUS) ×2
KIT PINK PAD W/HEAD ARM REST (MISCELLANEOUS) ×2 IMPLANT
KIT TURNOVER CYSTO (KITS) ×2 IMPLANT
LEGGING LITHOTOMY PAIR STRL (DRAPES) ×2 IMPLANT
MANIPULATOR UTERINE 4.5 ZUMI (MISCELLANEOUS) IMPLANT
NDL HYPO 22X1.5 SAFETY MO (MISCELLANEOUS) ×2 IMPLANT
NDL INSUFFLATION 14GA 120MM (NEEDLE) IMPLANT
NDL SPNL 22GX7 QUINCKE BK (NEEDLE) IMPLANT
NEEDLE HYPO 22X1.5 SAFETY MO (MISCELLANEOUS) ×2
NEEDLE INSUFFLATION 14GA 120MM (NEEDLE) ×2
NEEDLE SPNL 22GX7 QUINCKE BK (NEEDLE) ×2
OBTURATOR OPTICAL STND 8 DVNC (TROCAR) ×2
OBTURATOR OPTICALSTD 8 DVNC (TROCAR) ×2 IMPLANT
OCCLUDER COLPOPNEUMO (BALLOONS) ×2 IMPLANT
PACK ROBOT WH (CUSTOM PROCEDURE TRAY) ×2 IMPLANT
PACK ROBOTIC GOWN (GOWN DISPOSABLE) ×2 IMPLANT
PAD OB MATERNITY 4.3X12.25 (PERSONAL CARE ITEMS) ×2 IMPLANT
PAD PREP 24X48 CUFFED NSTRL (MISCELLANEOUS) ×2 IMPLANT
POUCH LAPAROSCOPIC INSTRUMENT (MISCELLANEOUS) ×2 IMPLANT
PROTECTOR NERVE ULNAR (MISCELLANEOUS) ×6 IMPLANT
SCISSORS MNPLR CVD DVNC XI (INSTRUMENTS) ×2 IMPLANT
SEAL UNIV 5-12 XI (MISCELLANEOUS) ×8 IMPLANT
SEALER VESSEL EXT DVNC XI (MISCELLANEOUS) IMPLANT
SET IRRIG Y TYPE TUR BLADDER L (SET/KITS/TRAYS/PACK) IMPLANT
SET TRI-LUMEN FLTR TB AIRSEAL (TUBING) ×2 IMPLANT
SLEEVE SCD COMPRESS KNEE MED (STOCKING) ×2 IMPLANT
SPIKE FLUID TRANSFER (MISCELLANEOUS) ×4 IMPLANT
STRIP CLOSURE SKIN 1/4X4 (GAUZE/BANDAGES/DRESSINGS) ×2 IMPLANT
SUT MNCRL AB 3-0 PS2 27 (SUTURE) ×4 IMPLANT
SUT VIC AB 0 CT1 27 (SUTURE)
SUT VIC AB 0 CT1 27XBRD ANBCTR (SUTURE) ×4 IMPLANT
SUT VICRYL 0 UR6 27IN ABS (SUTURE) ×2 IMPLANT
SUT VLOC 180 0 9IN GS21 (SUTURE) ×4 IMPLANT
SYS BAG RETRIEVAL 10MM (BASKET) ×2
SYSTEM BAG RETRIEVAL 10MM (BASKET) IMPLANT
TOWEL OR 17X24 6PK STRL BLUE (TOWEL DISPOSABLE) ×2 IMPLANT
TROCAR PORT AIRSEAL 5X120 (TROCAR) IMPLANT
TROCAR PORT AIRSEAL 8X120 (TROCAR) ×2 IMPLANT
WATER STERILE IRR 1000ML POUR (IV SOLUTION) ×2 IMPLANT
WATER STERILE IRR 500ML POUR (IV SOLUTION) IMPLANT

## 2023-06-07 NOTE — Anesthesia Postprocedure Evaluation (Signed)
Anesthesia Post Note  Patient: Pamela Bates  Procedure(s) Performed: XI ROBOTIC ASSISTED LEFT LAPAROSCOPIC OVARIAN CYSTECTOMY (Left: Pelvis) RESECTION OF ENDOMETROSIS (Pelvis)     Patient location during evaluation: PACU Anesthesia Type: General Level of consciousness: awake and alert Pain management: pain level controlled Vital Signs Assessment: post-procedure vital signs reviewed and stable Respiratory status: spontaneous breathing, nonlabored ventilation, respiratory function stable and patient connected to nasal cannula oxygen Cardiovascular status: blood pressure returned to baseline and stable Postop Assessment: no apparent nausea or vomiting Anesthetic complications: no  No notable events documented.  Last Vitals:  Vitals:   06/07/23 1615 06/07/23 1630  BP: 111/70 108/71  Pulse: 77 77  Resp: 16 13  Temp:    SpO2: 100% 95%    Last Pain:  Vitals:   06/07/23 1638  TempSrc:   PainSc: 6                  Dalton Mille S

## 2023-06-07 NOTE — Transfer of Care (Signed)
Immediate Anesthesia Transfer of Care Note  Patient: Pamela Bates  Procedure(s) Performed: XI ROBOTIC ASSISTED LEFT LAPAROSCOPIC OVARIAN CYSTECTOMY (Left: Pelvis) RESECTION OF ENDOMETROSIS (Pelvis)  Patient Location: PACU  Anesthesia Type:General  Level of Consciousness: awake, alert , and oriented  Airway & Oxygen Therapy: Patient Spontanous Breathing and Patient connected to nasal cannula oxygen  Post-op Assessment: Report given to RN and Post -op Vital signs reviewed and stable  Post vital signs: Reviewed and stable  Last Vitals:  Vitals Value Taken Time  BP 111/70 06/07/23 1615  Temp 36.6 C 06/07/23 1614  Pulse 78 06/07/23 1618  Resp 16 06/07/23 1618  SpO2 100 % 06/07/23 1618  Vitals shown include unfiled device data.  Last Pain:  Vitals:   06/07/23 1059  TempSrc: Oral  PainSc: 0-No pain      Patients Stated Pain Goal: 4 (06/07/23 1059)  Complications: No notable events documented.

## 2023-06-07 NOTE — Discharge Instructions (Addendum)
Call [facility] at [phone] if:   You have a temperature greater than or equal to 100.4 degrees Farenheit orally You have pain that is not made better by the pain medication given and taken as directed You have excessive bleeding or problems urinating  Take Colace (Docusate Sodium/Stool Softener) 100 mg 2-3 times daily while taking narcotic pain medicine to avoid constipation or until bowel movements are regular.  Take with food Ibuprofen 600 mg and Acetaminophen 500 mg (#2) tablets every 6 hours then as needed for post operative pain  You may drive after 1 week You may walk up steps  You may shower tomorrow You may resume a regular diet  Keep incisions clean and dry Do not lift over 15 pounds for 6 weeks Avoid anything in vagina  until after your post-operative visit; You may use a perineal pad for menstrual flow   Post Anesthesia Home Care Instructions  Activity: Get plenty of rest for the remainder of the day. A responsible individual must stay with you for 24 hours following the procedure.  For the next 24 hours, DO NOT: -Drive a car -Advertising copywriter -Drink alcoholic beverages -Take any medication unless instructed by your physician -Make any legal decisions or sign important papers.  Meals: Start with liquid foods such as gelatin or soup. Progress to regular foods as tolerated. Avoid greasy, spicy, heavy foods. If nausea and/or vomiting occur, drink only clear liquids until the nausea and/or vomiting subsides. Call your physician if vomiting continues.  Special Instructions/Symptoms: Your throat may feel dry or sore from the anesthesia or the breathing tube placed in your throat during surgery. If this causes discomfort, gargle with warm salt water. The discomfort should disappear within 24 hours.  If you had a scopolamine patch placed behind your ear for the management of post- operative nausea and/or vomiting:  1. The medication in the patch is effective  for 72 hours, after which it should be removed.  Wrap patch in a tissue and discard in the trash. Wash hands thoroughly with soap and water. 2. You may remove the patch earlier than 72 hours if you experience unpleasant side effects which may include dry mouth, dizziness or visual disturbances. 3. Avoid touching the patch. Wash your hands with soap and water after contact with the patch.    No acetaminophen/Tylenol until after 5 pm today if needed. No ibuprofen, Advil, Aleve, Motrin, ketorolac, meloxicam, naproxen, or other NSAIDS until after 11:30 pm today if needed. Toradol given at 5:37pm.  Oxycodone 5mg  taken at 5:10 pm today. Please take as directed on prescription.

## 2023-06-07 NOTE — Anesthesia Preprocedure Evaluation (Signed)
Anesthesia Evaluation  Patient identified by MRN, date of birth, ID band Patient awake    Reviewed: Allergy & Precautions, H&P , NPO status , Patient's Chart, lab work & pertinent test results, reviewed documented beta blocker date and time   History of Anesthesia Complications Negative for: history of anesthetic complications  Airway Mallampati: I  TM Distance: >3 FB     Dental no notable dental hx.    Pulmonary asthma    breath sounds clear to auscultation       Cardiovascular negative cardio ROS  Rhythm:Regular Rate:Normal     Neuro/Psych  PSYCHIATRIC DISORDERS Anxiety Depression    narcolepsy    GI/Hepatic negative GI ROS, Neg liver ROS,,,  Endo/Other  negative endocrine ROS    Renal/GU negative Renal ROS  Female GU complaint     Musculoskeletal negative musculoskeletal ROS (+)    Abdominal   Peds negative pediatric ROS (+)  Hematology   Anesthesia Other Findings   Reproductive/Obstetrics negative OB ROS                              Anesthesia Physical Anesthesia Plan  ASA: 2  Anesthesia Plan: General   Post-op Pain Management:    Induction: Intravenous  PONV Risk Score and Plan: 3 and Ondansetron and Dexamethasone  Airway Management Planned: Oral ETT  Additional Equipment:   Intra-op Plan:   Post-operative Plan: Extubation in OR  Informed Consent: I have reviewed the patients History and Physical, chart, labs and discussed the procedure including the risks, benefits and alternatives for the proposed anesthesia with the patient or authorized representative who has indicated his/her understanding and acceptance.     Dental advisory given  Plan Discussed with: CRNA  Anesthesia Plan Comments:          Anesthesia Quick Evaluation

## 2023-06-07 NOTE — Op Note (Signed)
Preoperative diagnosis: Left ovarian dermoid cyst  Postoperative diagnosis: Same with Stage 1 endometriosis  Anesthesia: General   Anesthesiologist: Dr. Ace Gins  Procedure: Robotically assisted left ovarian cystectomy and resection of endometriosis  Surgeon: Dr. Dois Davenport Aaric Dolph   Assistant: Henreitta Leber P.A.-C .  Estimated blood loss: 50 cc   Procedure:   After being informed of the planned procedure with possible complications including but not limited to bleeding, infection, injury to other organs, need for laparotomy, expected hospital stay and recovery, informed consent is obtained and patient is taken to or #5. She is placed in lithotomy position on a pink pad with both arms padded and tucked on each side and bilateral knee-high sequential compressive devices. She is given general anesthesia with endotracheal intubation without any complication. She is prepped and draped in a sterile fashion. A three-way Foley catheter is inserted in her bladder.   Pelvic exam reveals: Anteverted normal size mobile uterus with a normal right adnexa.  Left adnexa is enlarged approximately 8 cm smooth and mobile.  A weighted speculum is inserted in the vagina and the anterior lip of the cervix is grasped with a tenaculum forcep. The uterus was then sounded at 8 cm. We easily dilate the cervix using Hegar dilator to #27 which allows for easy placement of the intrauterine ZUMI manipulator.  Trocar placement is decided. We infiltrate  the umbilicus with 10 cc of ropivacaine per protocol and perform a 10 mm semi-elliptical incision which is brought down bluntly to the fascia. The fascia is identified and grasped with Coker forceps. The fascia is incised with Mayo scissors. Peritoneum is entered bluntly. A pursestring suture of 0 Vicryl is placed on the fascia and a 10 mm Hassan trocar is easily inserted in the abdominal cavity held in placed with a Purstring suture. This allows for easy insufflation of a  pneumoperitoneum using warmed CO2 at a maximum pressure of 15 mm of mercury. 60 cc of Ropivacaine 0.5 % diluted 1 in 1 is sent in the pelvis and the patient is positioned in reverse Trendelenburg. We then placed one 8mm robotic trocar on the left, one 8mm robotic trocar on the right and one 5 mm patient's side assistant trocar on the right after infiltrating every site with ropivacaine per protocol. The robot is docked on the right of the patient after positioning her in Trendelenburg. A monopolar scissor is inserted in arm #4 and a Long Bipolar forceps is inserted in arm #2.  Preparation and docking is completed in 49 minutes.   Observation: The uterus is of normal appearance.  The right ovary is normal.  The right tube has a paratubal cyst measuring 1 cm with a bluish appearance.  The left tube is normal.  The left ovary has a 9 cm ovarian cyst with smooth surface and good mobility as well as elongated mesosalpinx.  There is a cluster of bluish-brownish lesion in the anterior cul-de-sac covering the surface of 4 cm x 2 cm.  We also note a few brownish lesions on the left uterosacral ligament.  Otherwise the anatomy of the abdomen and pelvis is normal. Liver is visualized and normal. Appendix is not visualized.   We began by infiltrating the serosa of the left ovary with Vasopressine 20 units/100 cc of saline.  Using monopolar scissors, we open the ovarian serosa and proceed with systematic dissection of the ovarian cyst from the serosa. We use blunt and sharp dissection as well as traction/contra-traction. We free the cyst  with minimal cyst  rupture which is controlled by clamping.  The cyst is removed from the abdomen with an endobag inserted through the umbilical incision.  We return to the console to proceed with systematic resection of previously described endometriotic lesions using sharp dissection.  All the affected peritoneal space surfaces are resected and will be sent for pathology.  We  irrigated profusely to cleanse the pelvis and confirm a satisfactory hemostasis.  Console time is completed in 1 hour and 17 minutes.   All instruments are then removed and the robot is undocked.   All trochars are removed under direct visualization after evacuating the pneumoperitoneum.   The fascia of the supraumbilical incision is closed with the previously placed pursestring suture of 0 Vicryl. the fascia of the right patient side assistant trocar is closed with a figure-of-eight stitch of 0 Vicryl. All incisions are then closed with subcuticular suture of 3-0 Monocryl and Dermabond.   A speculum is inserted in the vagina and  hemostasis on the cervix is completed with a running lock suture of 2-0 Vicryl.  Instrument and sponge count is complete x2. Estimated blood loss is 50 cc.   The procedure is well tolerated by the patient who  is taken to recovery room in a well and stable condition.    Dr Estanislado Pandy was present and scrubbed at all times. Surgical assistance was required due to the robotic approach to the procedure.  Specimen: Ovarian cyst wall and peritoneal resection sent to pathology.

## 2023-06-07 NOTE — Anesthesia Procedure Notes (Signed)
Procedure Name: Intubation Date/Time: 06/07/2023 1:21 PM  Performed by: Kea Callan D, CRNAPre-anesthesia Checklist: Patient identified, Emergency Drugs available, Suction available and Patient being monitored Patient Re-evaluated:Patient Re-evaluated prior to induction Oxygen Delivery Method: Circle system utilized Preoxygenation: Pre-oxygenation with 100% oxygen Induction Type: IV induction Ventilation: Mask ventilation without difficulty Laryngoscope Size: Mac and 3 Grade View: Grade I Tube type: Oral Tube size: 7.0 mm Number of attempts: 1 Airway Equipment and Method: Stylet and Oral airway Placement Confirmation: ETT inserted through vocal cords under direct vision, positive ETCO2 and breath sounds checked- equal and bilateral Secured at: 21 cm Tube secured with: Tape Dental Injury: Teeth and Oropharynx as per pre-operative assessment

## 2023-06-07 NOTE — Interval H&P Note (Signed)
History and Physical Interval Note:  06/07/2023 1:08 PM  Pamela Bates  has presented today for surgery, with the diagnosis of LEFT OVARIAN DERMOID CYST CHRONIC PELVIC PAIN WITH SUSPICION OF ENDOMETRIOSIS.  The various methods of treatment have been discussed with the patient and family. After consideration of risks, benefits and other options for treatment, the patient has consented to  Procedure(s): XI ROBOTIC ASSISTED LEFT LAPAROSCOPIC OVARIAN CYSTECTOMY WITH PELVIC WASHING (Left) POSSIBLE RESECTION OF ENDOMETROSIS (N/A) as a surgical intervention.  The patient's history has been reviewed, patient examined, no change in status, stable for surgery.  I have reviewed the patient's chart and labs.  Questions were answered to the patient's satisfaction.     Dois Davenport A Lokelani Lutes

## 2023-06-08 ENCOUNTER — Encounter (HOSPITAL_BASED_OUTPATIENT_CLINIC_OR_DEPARTMENT_OTHER): Payer: Self-pay | Admitting: Obstetrics and Gynecology

## 2023-06-11 LAB — SURGICAL PATHOLOGY

## 2023-06-15 DIAGNOSIS — G8918 Other acute postprocedural pain: Secondary | ICD-10-CM | POA: Diagnosis not present

## 2023-07-26 DIAGNOSIS — K5900 Constipation, unspecified: Secondary | ICD-10-CM | POA: Diagnosis not present

## 2023-07-26 DIAGNOSIS — R102 Pelvic and perineal pain: Secondary | ICD-10-CM | POA: Diagnosis not present

## 2023-07-26 DIAGNOSIS — R278 Other lack of coordination: Secondary | ICD-10-CM | POA: Diagnosis not present

## 2023-07-26 DIAGNOSIS — M6281 Muscle weakness (generalized): Secondary | ICD-10-CM | POA: Diagnosis not present

## 2023-07-31 ENCOUNTER — Ambulatory Visit
Admission: RE | Admit: 2023-07-31 | Discharge: 2023-07-31 | Disposition: A | Discharge status: Home / Self Care | Payer: BC Managed Care – PPO | Source: Ambulatory Visit | Attending: Nurse Practitioner

## 2023-07-31 ENCOUNTER — Ambulatory Visit
Admission: RE | Admit: 2023-07-31 | Discharge: 2023-07-31 | Disposition: A | Discharge status: Home / Self Care | Payer: BC Managed Care – PPO | Source: Ambulatory Visit | Attending: Nurse Practitioner | Admitting: Nurse Practitioner

## 2023-07-31 ENCOUNTER — Other Ambulatory Visit: Payer: Self-pay | Admitting: Nurse Practitioner

## 2023-07-31 DIAGNOSIS — Z9889 Other specified postprocedural states: Secondary | ICD-10-CM

## 2023-07-31 DIAGNOSIS — N6012 Diffuse cystic mastopathy of left breast: Secondary | ICD-10-CM | POA: Diagnosis not present

## 2023-07-31 DIAGNOSIS — R928 Other abnormal and inconclusive findings on diagnostic imaging of breast: Secondary | ICD-10-CM | POA: Diagnosis not present

## 2023-07-31 DIAGNOSIS — Z1231 Encounter for screening mammogram for malignant neoplasm of breast: Secondary | ICD-10-CM

## 2023-09-03 ENCOUNTER — Other Ambulatory Visit (HOSPITAL_COMMUNITY)
Admission: RE | Admit: 2023-09-03 | Discharge: 2023-09-03 | Disposition: A | Discharge status: Home / Self Care | Payer: BC Managed Care – PPO | Source: Ambulatory Visit | Attending: Nurse Practitioner | Admitting: Nurse Practitioner

## 2023-09-03 ENCOUNTER — Ambulatory Visit (INDEPENDENT_AMBULATORY_CARE_PROVIDER_SITE_OTHER): Payer: BC Managed Care – PPO | Admitting: Nurse Practitioner

## 2023-09-03 VITALS — BP 112/74 | HR 119 | Ht 64.0 in | Wt 144.0 lb

## 2023-09-03 DIAGNOSIS — Z01419 Encounter for gynecological examination (general) (routine) without abnormal findings: Secondary | ICD-10-CM | POA: Diagnosis not present

## 2023-09-03 DIAGNOSIS — Z124 Encounter for screening for malignant neoplasm of cervix: Secondary | ICD-10-CM | POA: Insufficient documentation

## 2023-09-03 DIAGNOSIS — Z9889 Other specified postprocedural states: Secondary | ICD-10-CM

## 2023-09-03 DIAGNOSIS — N809 Endometriosis, unspecified: Secondary | ICD-10-CM | POA: Diagnosis not present

## 2023-09-03 DIAGNOSIS — D500 Iron deficiency anemia secondary to blood loss (chronic): Secondary | ICD-10-CM | POA: Diagnosis not present

## 2023-09-03 DIAGNOSIS — Z86018 Personal history of other benign neoplasm: Secondary | ICD-10-CM

## 2023-09-03 NOTE — Progress Notes (Signed)
Pamela Bates [birth year] [id number]   History:  32 y.o. G0 presents for annual exam. S/P 06/07/23 robotic left ovarian cystectomy for 8 cm dermoid cyst and excision of endometriosis with Dr. Estanislado Bates. Has had a difficult recovery with pain and bleeding. Started on [place] mid October. Was having lots of bleeding initially, now has some spotting most days. Pain is still present. Wants to stop taking Myfembree and does not want any hormonal contraception for management. Did Lupron and Norethindrone prior to surgery and did OK with it but not interested. Plans to see endometriosis pain specialist. Has had some improvement with pelvic floor PT. Emotional during visit. Is considering freezing eggs and having hysterectomy. Benign right breast biopsy 05/2022. Normal pap history.   Gynecologic History Patient's last menstrual period was 06/21/2023.   Contraception/Family planning: none Sexually active: Yes  Health Maintenance Last Pap: 05/2021. Results were: Normal per patient Last mammogram: 07/31/2023. Results were: Left breast benign cysts, benign right breast asymmetry Last colonoscopy: Not indicated Last Dexa: Not indicated  Past medical history, past surgical history, family history and social history were all reviewed and documented in the EPIC chart. Works for [employer]   ROS:  A ROS was performed and pertinent positives and negatives are included.  Exam:  Vitals:   09/03/23 1128  BP: 112/74  Pulse: (!) 119  SpO2: 100%  Weight: 144 lb (65.3 kg)  Height: 5\' 4"  (1.626 m)   Body mass index is 24.72 kg/m.   General appearance:  Normal Thyroid:  Symmetrical, normal in size, without palpable masses or nodularity. Respiratory  Auscultation:  Clear without wheezing or rhonchi Cardiovascular  Auscultation:  Regular rate, without rubs, murmurs or gallops  Edema/varicosities:  Not grossly evident Abdominal  Soft, without masses, guarding or rebound. Well-healed laparoscopic  incisions, tender with palpation  Liver/spleen:  No organomegaly noted  Hernia:  None appreciated  Skin  Inspection:  Grossly normal Breasts: Examined lying and sitting.   Right: Without masses, retractions, nipple discharge or axillary adenopathy.   Left: Without masses, retractions, nipple discharge or axillary adenopathy. Pelvic: External genitalia:  no lesions              Urethra:  normal appearing urethra with no masses, tenderness or lesions              Bartholins and Skenes: normal                 Vagina: normal appearing vagina with normal color and discharge, no lesions              Cervix: no lesions Bimanual Exam:  Uterus:  no masses or tenderness              Adnexa: no mass, fullness, tenderness              Rectovaginal: Deferred              Anus:  normal, no lesions   Patient informed chaperone available to be present for breast and pelvic exam. Patient has requested no chaperone to be present. Patient has been advised what will be completed during breast and pelvic exam.   Assessment/Plan:  32 y.o. G0 for annual exam.   Well female exam with routine gynecological exam - Education provided on SBEs, importance of preventative screenings, current guidelines, high calcium diet, regular exercise, and multivitamin daily. Labs with PCP.   Cervical cancer screening - Plan: Cytology - PAP( Pamela Bates). Normal pap history.  History of dermoid cyst excision - 8.8 cm left ovary. S/P left cystectomy 06/2023.   Endometriosis determined by laparoscopy - 06/07/2023 excision of endo. Currently taking Myfembree and wants to discontinue and is not interested in any hormonal contraception for management. We did discuss progression of endometriosis without treatment. She will consider seeing Dr. Karma Bates for management. Considering hysterectomy. Will continue Myfembree for now. Plans to see endometriosis pain specialist. Has had some improvement with pelvic floor PT.   Iron deficiency  anemia due to chronic blood loss - Plan: CBC with Differential/Platelet, Iron, TIBC and Ferritin Panel. Taking Accrufer daily.  Screening for cervical cancer - Normal Pap history.  Will repeat at 5-year interval per guidelines.  Screening for breast cancer - Being followed for benign left breast cysts and benign right breast asymmetry. Normal breast exam today.   Return in about 1 year (around 09/02/2024) for Annual.   Pamela Mackie DNP, 12:08 PM 09/03/2023

## 2023-09-04 ENCOUNTER — Encounter: Payer: Self-pay | Admitting: Nurse Practitioner

## 2023-09-04 LAB — IRON,TIBC AND FERRITIN PANEL
%SAT: 53 % — ABNORMAL HIGH (ref 16–45)
Ferritin: 21 ng/mL (ref 16–154)
Iron: 196 ug/dL — ABNORMAL HIGH (ref 40–190)
TIBC: 370 ug/dL (ref 250–450)

## 2023-09-04 LAB — CBC WITH DIFFERENTIAL/PLATELET
Absolute Lymphocytes: 1732 {cells}/uL (ref 850–3900)
Absolute Monocytes: 419 {cells}/uL (ref 200–950)
Basophils Absolute: 71 {cells}/uL (ref 0–200)
Basophils Relative: 1 %
Eosinophils Absolute: 291 {cells}/uL (ref 15–500)
Eosinophils Relative: 4.1 %
HCT: 40.3 % (ref 35.0–45.0)
Hemoglobin: 13 g/dL (ref 11.7–15.5)
MCH: 30.1 pg (ref 27.0–33.0)
MCHC: 32.3 g/dL (ref 32.0–36.0)
MCV: 93.3 fL (ref 80.0–100.0)
MPV: 11.6 fL (ref 7.5–12.5)
Monocytes Relative: 5.9 %
Neutro Abs: 4587 {cells}/uL (ref 1500–7800)
Neutrophils Relative %: 64.6 %
Platelets: 309 10*3/uL (ref 140–400)
RBC: 4.32 10*6/uL (ref 3.80–5.10)
RDW: 12.2 % (ref 11.0–15.0)
Total Lymphocyte: 24.4 %
WBC: 7.1 10*3/uL (ref 3.8–10.8)

## 2023-09-04 NOTE — Telephone Encounter (Signed)
A msg was sent to the appt desk to schedule the pt.   Per CS: "Left message for patient to call and schedule appointment."

## 2023-09-04 NOTE — Telephone Encounter (Signed)
 Pt LVM in triage line stating that she is scheduled now w/ Dr. Glennon on 09/20/2023 and just wanted to make sure that Dr. Glennon will have the chance to review her records just sent prior to her appt date/time.   Will reply via mychart msg and forward this to EB for her to review when she returns.

## 2023-09-06 LAB — CYTOLOGY - PAP
Comment: NEGATIVE
Diagnosis: NEGATIVE
High risk HPV: NEGATIVE

## 2023-09-06 NOTE — Telephone Encounter (Signed)
 Pt scheduled w/ EB on 09/20/2023. Encounter already sent to provider for review. Encounter closed.

## 2023-09-20 ENCOUNTER — Encounter: Payer: Self-pay | Admitting: Obstetrics and Gynecology

## 2023-09-20 ENCOUNTER — Ambulatory Visit: Payer: BC Managed Care – PPO | Admitting: Obstetrics and Gynecology

## 2023-09-20 VITALS — BP 98/60 | HR 78

## 2023-09-20 DIAGNOSIS — R102 Pelvic and perineal pain: Secondary | ICD-10-CM

## 2023-09-20 DIAGNOSIS — N809 Endometriosis, unspecified: Secondary | ICD-10-CM

## 2023-09-20 NOTE — Telephone Encounter (Signed)
Do you want the pt to schedule in-office PUS or external location? TIA.

## 2023-09-20 NOTE — Progress Notes (Signed)
   Acute Office Visit  Subjective:    Patient ID: Pamela Bates, female    DOB: Feb 23, 1991, 33 y.o.   MRN: [id number]   HPI 33 y.o. presents today for Consult (Consult for endometriosis//jj) .10/24 patient had robotic left ovarian dermoid cystectomy.  Incidental stage 1 endometriosis was found and excised.  She reports recovery was difficult and she continued to have pain and bleeding.  She has tried several birth control options such as IUD, nexplanon, patch, oral pills. She is now on myfembree and reports she is still having heavy VB every two weeks.  She is doing pelvic PT and is going to establish care at the chronic pain management clinic at [facility] at the end of the month.  She would just like to take a break from medication at this point.  Patient's last menstrual period was 06/21/2023.    Review of Systems     Objective:    OBGyn Exam  BP 98/60   Pulse 78   LMP 06/21/2023 Comment: bleeding about every 2 weeks on myfembree  SpO2 99%  Wt Readings from Last 3 Encounters:  09/03/23 144 lb (65.3 kg)  06/07/23 141 lb 3.2 oz (64 kg)  06/01/23 137 lb (62.1 kg)        Patient informed chaperone available to be present for breast and/or pelvic exam. Patient has requested no chaperone to be present. Patient has been advised what will be completed during breast and pelvic exam.   Assessment & Plan:  Endometriosis, chronic pelvic pain, h/o left ovarian cystectomy Discussed stopping the myfembree.  Other options discussed with [name]. She will do some research on this.  To follow in 6 months or sooner with any concerns. Dr. Karma Greaser 15 minutes spent on reviewing records, imaging,  and one on one patient time and counseling patient and documentation Dr. Judith Blonder

## 2023-09-21 NOTE — Telephone Encounter (Signed)
Msg sent to the appt desk.

## 2023-09-25 NOTE — Telephone Encounter (Signed)
Pamela Bates, CMA Left message for patient to call and schedule appointment.

## 2023-10-04 DIAGNOSIS — N809 Endometriosis, unspecified: Secondary | ICD-10-CM | POA: Diagnosis not present

## 2023-10-04 DIAGNOSIS — R102 Pelvic and perineal pain: Secondary | ICD-10-CM | POA: Diagnosis not present

## 2023-10-04 DIAGNOSIS — M7918 Myalgia, other site: Secondary | ICD-10-CM | POA: Diagnosis not present

## 2023-10-25 ENCOUNTER — Other Ambulatory Visit: Payer: BC Managed Care – PPO

## 2023-10-25 ENCOUNTER — Other Ambulatory Visit: Payer: BC Managed Care – PPO | Admitting: Obstetrics and Gynecology

## 2023-11-06 DIAGNOSIS — J019 Acute sinusitis, unspecified: Secondary | ICD-10-CM | POA: Diagnosis not present

## 2023-11-06 DIAGNOSIS — M7918 Myalgia, other site: Secondary | ICD-10-CM | POA: Diagnosis not present

## 2023-11-06 DIAGNOSIS — R0981 Nasal congestion: Secondary | ICD-10-CM | POA: Diagnosis not present

## 2023-11-06 DIAGNOSIS — N809 Endometriosis, unspecified: Secondary | ICD-10-CM | POA: Diagnosis not present

## 2023-11-06 DIAGNOSIS — J3489 Other specified disorders of nose and nasal sinuses: Secondary | ICD-10-CM | POA: Diagnosis not present

## 2023-11-06 DIAGNOSIS — J029 Acute pharyngitis, unspecified: Secondary | ICD-10-CM | POA: Diagnosis not present

## 2023-11-08 ENCOUNTER — Other Ambulatory Visit: Payer: BC Managed Care – PPO

## 2023-11-08 ENCOUNTER — Other Ambulatory Visit: Payer: BC Managed Care – PPO | Admitting: Obstetrics and Gynecology

## 2024-05-21 ENCOUNTER — Encounter: Payer: Self-pay | Admitting: Nurse Practitioner

## 2024-05-21 ENCOUNTER — Ambulatory Visit: Admitting: Nurse Practitioner

## 2024-05-21 VITALS — BP 114/78 | HR 105 | Resp 16

## 2024-05-21 DIAGNOSIS — R102 Pelvic and perineal pain: Secondary | ICD-10-CM | POA: Diagnosis not present

## 2024-05-21 DIAGNOSIS — N912 Amenorrhea, unspecified: Secondary | ICD-10-CM | POA: Diagnosis not present

## 2024-05-21 DIAGNOSIS — Z3201 Encounter for pregnancy test, result positive: Secondary | ICD-10-CM

## 2024-05-21 DIAGNOSIS — Z3A01 Less than 8 weeks gestation of pregnancy: Secondary | ICD-10-CM

## 2024-05-21 LAB — PREGNANCY, URINE: Preg Test, Ur: POSITIVE — AB

## 2024-05-21 NOTE — Progress Notes (Signed)
   Acute Office Visit  Subjective:    Patient ID: Pamela Bates, female    DOB: [birth year], 33 y.o.   MRN: [id number]   HPI 33 y.o. G0 presents today for pregnancy confirmation. LMP 04/02/24. + UPT at home 9/7. Has had some intermittent cramping, had pain with a sneeze, mid pelvis. No bleeding. Unplanned, but happy with pregnancy. Started PNV.   Patient's last menstrual period was 04/02/2024 (exact date).    Review of Systems  Constitutional: Negative.   Genitourinary:  Positive for pelvic pain (Mild, cramping). Negative for vaginal bleeding.       Objective:    Physical Exam Constitutional:      Appearance: Normal appearance.   GU: Not indicated  BP 114/78   Pulse (!) 105   Resp 16   LMP 04/02/2024 (Exact Date)  Wt Readings from Last 3 Encounters:  09/03/23 144 lb (65.3 kg)  06/07/23 141 lb 3.2 oz (64 kg)  06/01/23 137 lb (62.1 kg)        UPT +  Assessment & Plan:   Problem List Items Addressed This Visit   None Visit Diagnoses       Less than [redacted] weeks gestation of pregnancy    -  Primary     Amenorrhea       Relevant Orders   Pregnancy, urine      Plan: + UPT in office today. Educated on healthy pregnancy behaviors, warning signs. Continue PNV. Provided list of local OB offices.   Return if symptoms worsen or fail to improve.    Pamela DELENA Shutter DNP, 8:44 AM 05/21/2024

## 2024-05-29 DIAGNOSIS — Z3201 Encounter for pregnancy test, result positive: Secondary | ICD-10-CM | POA: Diagnosis not present

## 2024-06-12 DIAGNOSIS — Z3401 Encounter for supervision of normal first pregnancy, first trimester: Secondary | ICD-10-CM | POA: Diagnosis not present

## 2024-06-12 DIAGNOSIS — Z118 Encounter for screening for other infectious and parasitic diseases: Secondary | ICD-10-CM | POA: Diagnosis not present

## 2024-06-12 DIAGNOSIS — Z3689 Encounter for other specified antenatal screening: Secondary | ICD-10-CM | POA: Diagnosis not present

## 2024-06-12 DIAGNOSIS — Z1331 Encounter for screening for depression: Secondary | ICD-10-CM | POA: Diagnosis not present

## 2024-07-17 DIAGNOSIS — Z3402 Encounter for supervision of normal first pregnancy, second trimester: Secondary | ICD-10-CM | POA: Diagnosis not present

## 2024-07-17 DIAGNOSIS — Z361 Encounter for antenatal screening for raised alphafetoprotein level: Secondary | ICD-10-CM | POA: Diagnosis not present

## 2024-07-22 ENCOUNTER — Other Ambulatory Visit: Payer: Self-pay
